# Patient Record
Sex: Male | Born: 1960 | Race: White | Hispanic: No | Marital: Married | Smoking: Never smoker
Health system: Southern US, Community
[De-identification: ages and names within clinical notes are randomized; demographics above are authoritative.]

## PROBLEM LIST (undated history)

## (undated) DIAGNOSIS — I517 Cardiomegaly: Secondary | ICD-10-CM

## (undated) DIAGNOSIS — N400 Enlarged prostate without lower urinary tract symptoms: Secondary | ICD-10-CM

## (undated) DIAGNOSIS — Z9889 Other specified postprocedural states: Secondary | ICD-10-CM

## (undated) DIAGNOSIS — R7989 Other specified abnormal findings of blood chemistry: Secondary | ICD-10-CM

## (undated) DIAGNOSIS — Z8719 Personal history of other diseases of the digestive system: Secondary | ICD-10-CM

## (undated) DIAGNOSIS — I10 Essential (primary) hypertension: Secondary | ICD-10-CM

## (undated) DIAGNOSIS — I351 Nonrheumatic aortic (valve) insufficiency: Secondary | ICD-10-CM

## (undated) DIAGNOSIS — T4145XA Adverse effect of unspecified anesthetic, initial encounter: Secondary | ICD-10-CM

## (undated) DIAGNOSIS — S83289A Other tear of lateral meniscus, current injury, unspecified knee, initial encounter: Secondary | ICD-10-CM

## (undated) DIAGNOSIS — R29898 Other symptoms and signs involving the musculoskeletal system: Secondary | ICD-10-CM

## (undated) DIAGNOSIS — R112 Nausea with vomiting, unspecified: Secondary | ICD-10-CM

## (undated) DIAGNOSIS — M549 Dorsalgia, unspecified: Secondary | ICD-10-CM

## (undated) DIAGNOSIS — G43909 Migraine, unspecified, not intractable, without status migrainosus: Secondary | ICD-10-CM

## (undated) DIAGNOSIS — E559 Vitamin D deficiency, unspecified: Secondary | ICD-10-CM

## (undated) DIAGNOSIS — I519 Heart disease, unspecified: Secondary | ICD-10-CM

## (undated) DIAGNOSIS — K219 Gastro-esophageal reflux disease without esophagitis: Secondary | ICD-10-CM

## (undated) DIAGNOSIS — Z862 Personal history of diseases of the blood and blood-forming organs and certain disorders involving the immune mechanism: Secondary | ICD-10-CM

## (undated) DIAGNOSIS — M169 Osteoarthritis of hip, unspecified: Secondary | ICD-10-CM

## (undated) HISTORY — PX: KNEE ARTHROSCOPY: SHX127

## (undated) HISTORY — PX: BACK SURGERY: SHX140

## (undated) HISTORY — PX: OTHER SURGICAL HISTORY: SHX169

---

## 1996-09-02 DIAGNOSIS — T8859XA Other complications of anesthesia, initial encounter: Secondary | ICD-10-CM

## 1996-09-02 HISTORY — DX: Other complications of anesthesia, initial encounter: T88.59XA

## 2008-09-02 HISTORY — PX: OTHER SURGICAL HISTORY: SHX169

## 2008-09-02 HISTORY — PX: HERNIA REPAIR: SHX51

## 2011-09-03 HISTORY — PX: LUNG BIOPSY: SHX232

## 2015-01-01 DIAGNOSIS — M169 Osteoarthritis of hip, unspecified: Secondary | ICD-10-CM

## 2015-01-01 HISTORY — DX: Osteoarthritis of hip, unspecified: M16.9

## 2015-01-27 ENCOUNTER — Emergency Department (HOSPITAL_COMMUNITY): Payer: BLUE CROSS/BLUE SHIELD

## 2015-01-27 ENCOUNTER — Observation Stay (HOSPITAL_COMMUNITY): Payer: BLUE CROSS/BLUE SHIELD

## 2015-01-27 ENCOUNTER — Observation Stay (HOSPITAL_COMMUNITY)
Admission: EM | Admit: 2015-01-27 | Discharge: 2015-01-29 | Disposition: A | Discharge status: Home / Self Care | Payer: BLUE CROSS/BLUE SHIELD | Attending: Internal Medicine | Admitting: Internal Medicine

## 2015-01-27 ENCOUNTER — Encounter (HOSPITAL_COMMUNITY): Payer: Self-pay | Admitting: Emergency Medicine

## 2015-01-27 DIAGNOSIS — D869 Sarcoidosis, unspecified: Secondary | ICD-10-CM | POA: Insufficient documentation

## 2015-01-27 DIAGNOSIS — R0789 Other chest pain: Secondary | ICD-10-CM | POA: Diagnosis not present

## 2015-01-27 DIAGNOSIS — R4781 Slurred speech: Secondary | ICD-10-CM | POA: Diagnosis present

## 2015-01-27 DIAGNOSIS — I10 Essential (primary) hypertension: Secondary | ICD-10-CM | POA: Diagnosis present

## 2015-01-27 DIAGNOSIS — R202 Paresthesia of skin: Secondary | ICD-10-CM | POA: Diagnosis present

## 2015-01-27 DIAGNOSIS — G4700 Insomnia, unspecified: Secondary | ICD-10-CM | POA: Diagnosis not present

## 2015-01-27 DIAGNOSIS — R7989 Other specified abnormal findings of blood chemistry: Secondary | ICD-10-CM

## 2015-01-27 DIAGNOSIS — R29810 Facial weakness: Secondary | ICD-10-CM | POA: Diagnosis not present

## 2015-01-27 DIAGNOSIS — M25559 Pain in unspecified hip: Secondary | ICD-10-CM | POA: Insufficient documentation

## 2015-01-27 DIAGNOSIS — R471 Dysarthria and anarthria: Secondary | ICD-10-CM | POA: Insufficient documentation

## 2015-01-27 DIAGNOSIS — R51 Headache: Secondary | ICD-10-CM | POA: Diagnosis not present

## 2015-01-27 DIAGNOSIS — N400 Enlarged prostate without lower urinary tract symptoms: Secondary | ICD-10-CM | POA: Diagnosis not present

## 2015-01-27 DIAGNOSIS — R200 Anesthesia of skin: Secondary | ICD-10-CM

## 2015-01-27 DIAGNOSIS — F419 Anxiety disorder, unspecified: Secondary | ICD-10-CM | POA: Insufficient documentation

## 2015-01-27 DIAGNOSIS — E559 Vitamin D deficiency, unspecified: Secondary | ICD-10-CM | POA: Diagnosis present

## 2015-01-27 DIAGNOSIS — I6783 Posterior reversible encephalopathy syndrome: Secondary | ICD-10-CM | POA: Diagnosis present

## 2015-01-27 DIAGNOSIS — Z7952 Long term (current) use of systemic steroids: Secondary | ICD-10-CM | POA: Diagnosis not present

## 2015-01-27 DIAGNOSIS — M25551 Pain in right hip: Secondary | ICD-10-CM | POA: Diagnosis not present

## 2015-01-27 DIAGNOSIS — R079 Chest pain, unspecified: Secondary | ICD-10-CM | POA: Diagnosis not present

## 2015-01-27 DIAGNOSIS — Z862 Personal history of diseases of the blood and blood-forming organs and certain disorders involving the immune mechanism: Secondary | ICD-10-CM

## 2015-01-27 DIAGNOSIS — R0781 Pleurodynia: Secondary | ICD-10-CM | POA: Diagnosis present

## 2015-01-27 DIAGNOSIS — R519 Headache, unspecified: Secondary | ICD-10-CM | POA: Diagnosis present

## 2015-01-27 DIAGNOSIS — Z8709 Personal history of other diseases of the respiratory system: Secondary | ICD-10-CM

## 2015-01-27 DIAGNOSIS — G9340 Encephalopathy, unspecified: Secondary | ICD-10-CM | POA: Diagnosis not present

## 2015-01-27 DIAGNOSIS — R4182 Altered mental status, unspecified: Secondary | ICD-10-CM

## 2015-01-27 HISTORY — DX: Essential (primary) hypertension: I10

## 2015-01-27 HISTORY — DX: Migraine, unspecified, not intractable, without status migrainosus: G43.909

## 2015-01-27 HISTORY — DX: Benign prostatic hyperplasia without lower urinary tract symptoms: N40.0

## 2015-01-27 HISTORY — DX: Other specified abnormal findings of blood chemistry: R79.89

## 2015-01-27 HISTORY — DX: Personal history of diseases of the blood and blood-forming organs and certain disorders involving the immune mechanism: Z86.2

## 2015-01-27 HISTORY — DX: Vitamin D deficiency, unspecified: E55.9

## 2015-01-27 HISTORY — PX: LUMBAR PUNCTURE: SHX1985

## 2015-01-27 LAB — CSF CELL COUNT WITH DIFFERENTIAL
RBC Count, CSF: 70 /mm3 — ABNORMAL HIGH
RBC Count, CSF: 770 /mm3 — ABNORMAL HIGH
Tube #: 1
Tube #: 4
WBC CSF: 1 /mm3 (ref 0–5)
WBC CSF: 1 /mm3 (ref 0–5)

## 2015-01-27 LAB — I-STAT CHEM 8, ED
BUN: 19 mg/dL (ref 6–20)
CALCIUM ION: 1.16 mmol/L (ref 1.12–1.23)
Chloride: 107 mmol/L (ref 101–111)
Creatinine, Ser: 1.2 mg/dL (ref 0.61–1.24)
Glucose, Bld: 170 mg/dL — ABNORMAL HIGH (ref 65–99)
HEMATOCRIT: 43 % (ref 39.0–52.0)
Hemoglobin: 14.6 g/dL (ref 13.0–17.0)
Potassium: 3.6 mmol/L (ref 3.5–5.1)
Sodium: 142 mmol/L (ref 135–145)
TCO2: 18 mmol/L (ref 0–100)

## 2015-01-27 LAB — PROTIME-INR
INR: 1.04 (ref 0.00–1.49)
PROTHROMBIN TIME: 13.8 s (ref 11.6–15.2)

## 2015-01-27 LAB — COMPREHENSIVE METABOLIC PANEL
ALBUMIN: 3.9 g/dL (ref 3.5–5.0)
ALK PHOS: 50 U/L (ref 38–126)
ALT: 25 U/L (ref 17–63)
ANION GAP: 11 (ref 5–15)
AST: 23 U/L (ref 15–41)
BUN: 17 mg/dL (ref 6–20)
CALCIUM: 8.6 mg/dL — AB (ref 8.9–10.3)
CO2: 20 mmol/L — AB (ref 22–32)
CREATININE: 1.33 mg/dL — AB (ref 0.61–1.24)
Chloride: 108 mmol/L (ref 101–111)
GFR calc non Af Amer: 58 mL/min — ABNORMAL LOW (ref >60)
Glucose, Bld: 168 mg/dL — ABNORMAL HIGH (ref 65–99)
POTASSIUM: 3.6 mmol/L (ref 3.5–5.1)
SODIUM: 139 mmol/L (ref 135–145)
TOTAL PROTEIN: 6.2 g/dL — AB (ref 6.5–8.1)
Total Bilirubin: 0.6 mg/dL (ref 0.3–1.2)

## 2015-01-27 LAB — CBC
HCT: 43.3 % (ref 39.0–52.0)
HEMOGLOBIN: 15.3 g/dL (ref 13.0–17.0)
MCH: 29.1 pg (ref 26.0–34.0)
MCHC: 35.3 g/dL (ref 30.0–36.0)
MCV: 82.3 fL (ref 78.0–100.0)
Platelets: 188 10*3/uL (ref 150–400)
RBC: 5.26 MIL/uL (ref 4.22–5.81)
RDW: 12.1 % (ref 11.5–15.5)
WBC: 5 10*3/uL (ref 4.0–10.5)

## 2015-01-27 LAB — DIFFERENTIAL
BASOS PCT: 1 % (ref 0–1)
Basophils Absolute: 0 10*3/uL (ref 0.0–0.1)
EOS PCT: 4 % (ref 0–5)
Eosinophils Absolute: 0.2 10*3/uL (ref 0.0–0.7)
Lymphocytes Relative: 38 % (ref 12–46)
Lymphs Abs: 1.8 10*3/uL (ref 0.7–4.0)
Monocytes Absolute: 0.3 10*3/uL (ref 0.1–1.0)
Monocytes Relative: 7 % (ref 3–12)
NEUTROS ABS: 2.5 10*3/uL (ref 1.7–7.7)
Neutrophils Relative %: 50 % (ref 43–77)

## 2015-01-27 LAB — URINALYSIS, ROUTINE W REFLEX MICROSCOPIC
BILIRUBIN URINE: NEGATIVE
Glucose, UA: NEGATIVE mg/dL
HGB URINE DIPSTICK: NEGATIVE
Ketones, ur: NEGATIVE mg/dL
LEUKOCYTES UA: NEGATIVE
Nitrite: NEGATIVE
PROTEIN: NEGATIVE mg/dL
Specific Gravity, Urine: 1.019 (ref 1.005–1.030)
Urobilinogen, UA: 0.2 mg/dL (ref 0.0–1.0)
pH: 5 (ref 5.0–8.0)

## 2015-01-27 LAB — TROPONIN I: Troponin I: <0.03 ng/mL (ref <0.031)

## 2015-01-27 LAB — RAPID URINE DRUG SCREEN, HOSP PERFORMED
Amphetamines: NOT DETECTED
BENZODIAZEPINES: NOT DETECTED
Barbiturates: NOT DETECTED
COCAINE: NOT DETECTED
Opiates: NOT DETECTED
TETRAHYDROCANNABINOL: NOT DETECTED

## 2015-01-27 LAB — GRAM STAIN

## 2015-01-27 LAB — ETHANOL: Alcohol, Ethyl (B): <5 mg/dL (ref <5)

## 2015-01-27 LAB — APTT: APTT: 40 s — AB (ref 24–37)

## 2015-01-27 LAB — TSH: TSH: 2.788 u[IU]/mL (ref 0.350–4.500)

## 2015-01-27 LAB — PROTEIN AND GLUCOSE, CSF
GLUCOSE CSF: 60 mg/dL (ref 40–70)
Total  Protein, CSF: 74 mg/dL — ABNORMAL HIGH (ref 15–45)

## 2015-01-27 LAB — I-STAT TROPONIN, ED: Troponin i, poc: 0 ng/mL (ref 0.00–0.08)

## 2015-01-27 MED ORDER — LIDOCAINE HCL 2 % IJ SOLN
10.0000 mL | Freq: Once | INTRAMUSCULAR | Status: AC
  Administered 2015-01-27: 200 mg via INTRADERMAL
  Filled 2015-01-27: qty 20

## 2015-01-27 MED ORDER — ACETAMINOPHEN 325 MG PO TABS
650.0000 mg | ORAL_TABLET | Freq: Four times a day (QID) | ORAL | Status: DC | PRN
  Administered 2015-01-27 – 2015-01-29 (×3): 650 mg via ORAL
  Filled 2015-01-27 (×3): qty 2

## 2015-01-27 MED ORDER — ENOXAPARIN SODIUM 40 MG/0.4ML ~~LOC~~ SOLN
40.0000 mg | SUBCUTANEOUS | Status: DC
  Filled 2015-01-27: qty 0.4

## 2015-01-27 MED ORDER — IOHEXOL 350 MG/ML SOLN
50.0000 mL | Freq: Once | INTRAVENOUS | Status: AC | PRN
  Administered 2015-01-27: 50 mL via INTRAVENOUS

## 2015-01-27 MED ORDER — ASPIRIN EC 325 MG PO TBEC
325.0000 mg | DELAYED_RELEASE_TABLET | Freq: Once | ORAL | Status: AC
  Administered 2015-01-27: 325 mg via ORAL
  Filled 2015-01-27: qty 1

## 2015-01-27 MED ORDER — STROKE: EARLY STAGES OF RECOVERY BOOK
Freq: Once | Status: AC
  Administered 2015-01-27: 15:00:00

## 2015-01-27 NOTE — Progress Notes (Signed)
Patient arrived to [id number]. Patient is alert and oriented, NIH 3, VSS, denies any pain, headache, nausea. Tele set up. Patient was oriented to room and floor. Patient vocalized understanding of falls policy, states he will ask for staff assistance prior to ambulation. SCDs ordered. Call bell within patient's reach. Will continue to monitor closely.

## 2015-01-27 NOTE — ED Notes (Signed)
Dr. Rancour at bedside. 
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
## 2015-01-27 NOTE — Progress Notes (Signed)
EEG completed, results pending. 
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
## 2015-01-27 NOTE — Progress Notes (Signed)
Care order completed, [place] from [facility] [place]. Patient aware, patient consent given. Office called, Diplomatic Services operational officersecretary to send medical records prior to closing today.

## 2015-01-27 NOTE — ED Notes (Addendum)
Admitting MD at bedside.  RN on the floor made aware of the reason patient not moved at this time.
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
## 2015-01-27 NOTE — Procedures (Signed)
ELECTROENCEPHALOGRAM REPORT   Patient: Daniel Norman      Room #: A07 ED Age: 54 y.o.        Sex: male Referring Physician: Dr Manus Gunningancour Report Date:  01/27/2015        Interpreting Physician: Omelia BlackwaterSUMNER, Beatriz Quintela JUSTIN  History: Daniel Norman is an 54 y.o. male presenting with lethargy, left facial droop and headache  Medications:  Scheduled: . lidocaine  10 mL Intradermal Once    Conditions of Recording:  This is a 16 channel EEG carried out with the patient in the drowsy state.  Description:  The waking background activity consists of a very low voltage, symmetrical, fairly well organized mix of theta and alpha activity, seen from the parieto-occipital and posterior temporal regions.  Low voltage fast activity, poorly organized, is seen anteriorly and is at times superimposed on more posterior regions.  A mixture of theta and alpha rhythms are seen from the central and temporal regions. No focal slowing or epileptiform activity is noted.   The patient drowses with slowing to irregular, low voltage theta and beta activity. Normal sleep architecture is not observed.   Hyperventilation and intermittent photic stimulation was not performed.     IMPRESSION: Normal drowsy electroencephalogram.There are no focal lateralizing or epileptiform features.   Elspeth Choeter Bernie Fobes, DO [facility] [phone]  If 7pm- 7am, please page neurology on call as listed in AMION. 01/27/2015, 12:57 PM
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
## 2015-01-28 ENCOUNTER — Observation Stay (HOSPITAL_COMMUNITY): Payer: BLUE CROSS/BLUE SHIELD

## 2015-01-28 DIAGNOSIS — G9340 Encephalopathy, unspecified: Secondary | ICD-10-CM

## 2015-01-28 LAB — HEMOGLOBIN A1C
HEMOGLOBIN A1C: 5.4 % (ref 4.8–5.6)
Mean Plasma Glucose: 108 mg/dL

## 2015-01-28 LAB — LIPID PANEL
Cholesterol: 134 mg/dL (ref 0–200)
HDL: 38 mg/dL — ABNORMAL LOW (ref >40)
LDL Cholesterol: 76 mg/dL (ref 0–99)
Total CHOL/HDL Ratio: 3.5 RATIO
Triglycerides: 98 mg/dL (ref <150)
VLDL: 20 mg/dL (ref 0–40)

## 2015-01-28 LAB — C-REACTIVE PROTEIN: CRP: <0.5 mg/dL (ref <1.0)

## 2015-01-28 LAB — BASIC METABOLIC PANEL
Anion gap: 9 (ref 5–15)
BUN: 12 mg/dL (ref 6–20)
CALCIUM: 9.1 mg/dL (ref 8.9–10.3)
CHLORIDE: 104 mmol/L (ref 101–111)
CO2: 27 mmol/L (ref 22–32)
CREATININE: 1.45 mg/dL — AB (ref 0.61–1.24)
GFR calc non Af Amer: 52 mL/min — ABNORMAL LOW (ref >60)
Glucose, Bld: 118 mg/dL — ABNORMAL HIGH (ref 65–99)
Potassium: 4.1 mmol/L (ref 3.5–5.1)
Sodium: 140 mmol/L (ref 135–145)

## 2015-01-28 LAB — SEDIMENTATION RATE: Sed Rate: 5 mm/hr (ref 0–16)

## 2015-01-28 LAB — SODIUM, URINE, RANDOM: Sodium, Ur: 191 mmol/L

## 2015-01-28 LAB — CREATININE, URINE, RANDOM: Creatinine, Urine: 150.33 mg/dL

## 2015-01-28 LAB — TROPONIN I

## 2015-01-28 MED ORDER — GADOBENATE DIMEGLUMINE 529 MG/ML IV SOLN
20.0000 mL | Freq: Once | INTRAVENOUS | Status: AC | PRN
  Administered 2015-01-28: 20 mL via INTRAVENOUS

## 2015-01-28 MED ORDER — CYCLOBENZAPRINE HCL 5 MG PO TABS
7.5000 mg | ORAL_TABLET | Freq: Once | ORAL | Status: AC
  Administered 2015-01-28: 7.5 mg via ORAL
  Filled 2015-01-28: qty 1.5

## 2015-01-28 MED ORDER — FINASTERIDE 5 MG PO TABS
5.0000 mg | ORAL_TABLET | Freq: Every day | ORAL | Status: DC
  Administered 2015-01-28 – 2015-01-29 (×2): 5 mg via ORAL
  Filled 2015-01-28 (×2): qty 1

## 2015-01-28 MED ORDER — TAMSULOSIN HCL 0.4 MG PO CAPS
0.4000 mg | ORAL_CAPSULE | Freq: Every day | ORAL | Status: DC
  Administered 2015-01-28 – 2015-01-29 (×2): 0.4 mg via ORAL
  Filled 2015-01-28 (×2): qty 1
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
## 2015-01-28 NOTE — Progress Notes (Signed)
Patient complaining of neck spasm requesting for a muscle relaxant Call to M.D order for one time dose flexiril  Given.

## 2015-01-28 NOTE — Progress Notes (Signed)
Message sent via Amion text re: patient stating he does not need tele and can be DCd per neuro MD.
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
## 2015-01-28 NOTE — Progress Notes (Signed)
Called to patient room by central tele. Patient has tele box off. When questioned the patient states"the doctor says I dont need this because I didn't have a stroke." Educated that neuro was consulted and his attending would have to DC the telemetry. MD paged at [phone]. Central tele instructed to put on standby until attending MD can clarify.

## 2015-01-28 NOTE — Progress Notes (Signed)
Per conversations with Dr. Hosie PoissonSumner, patient is stable for discharge today. I spoke with Dr. Mikey BussingHoffman who walked me through the patient's discharge orders as I have not directly been involved in this patient's care since his admission.

## 2015-01-29 ENCOUNTER — Observation Stay (HOSPITAL_COMMUNITY): Payer: BLUE CROSS/BLUE SHIELD

## 2015-01-29 DIAGNOSIS — I351 Nonrheumatic aortic (valve) insufficiency: Secondary | ICD-10-CM

## 2015-01-29 DIAGNOSIS — G9340 Encephalopathy, unspecified: Secondary | ICD-10-CM | POA: Diagnosis not present

## 2015-01-29 DIAGNOSIS — R55 Syncope and collapse: Secondary | ICD-10-CM

## 2015-01-29 DIAGNOSIS — I6783 Posterior reversible encephalopathy syndrome: Secondary | ICD-10-CM | POA: Diagnosis not present

## 2015-01-29 DIAGNOSIS — I5189 Other ill-defined heart diseases: Secondary | ICD-10-CM

## 2015-01-29 DIAGNOSIS — I517 Cardiomegaly: Secondary | ICD-10-CM

## 2015-01-29 HISTORY — DX: Other ill-defined heart diseases: I51.89

## 2015-01-29 HISTORY — DX: Cardiomegaly: I51.7

## 2015-01-29 HISTORY — DX: Nonrheumatic aortic (valve) insufficiency: I35.1

## 2015-01-29 LAB — BASIC METABOLIC PANEL
Anion gap: 5 (ref 5–15)
BUN: 14 mg/dL (ref 6–20)
CALCIUM: 8.8 mg/dL — AB (ref 8.9–10.3)
CHLORIDE: 107 mmol/L (ref 101–111)
CO2: 27 mmol/L (ref 22–32)
Creatinine, Ser: 1.31 mg/dL — ABNORMAL HIGH (ref 0.61–1.24)
GFR calc Af Amer: >60 mL/min (ref >60)
GFR, EST NON AFRICAN AMERICAN: 59 mL/min — AB (ref >60)
Glucose, Bld: 99 mg/dL (ref 65–99)
Potassium: 3.4 mmol/L — ABNORMAL LOW (ref 3.5–5.1)
SODIUM: 139 mmol/L (ref 135–145)

## 2015-01-29 MED ORDER — METHOCARBAMOL 500 MG PO TABS
1000.0000 mg | ORAL_TABLET | Freq: Four times a day (QID) | ORAL | Status: DC | PRN
  Administered 2015-01-29 (×2): 1000 mg via ORAL
  Filled 2015-01-29 (×2): qty 2

## 2015-01-29 MED ORDER — TERAZOSIN HCL 5 MG PO CAPS
5.0000 mg | ORAL_CAPSULE | Freq: Every day | ORAL | Status: DC
  Filled 2015-01-29: qty 1

## 2015-01-29 MED ORDER — LOSARTAN POTASSIUM 50 MG PO TABS: 50.0000 mg | ORAL_TABLET | Freq: Every day | ORAL | Status: DC

## 2015-01-29 MED ORDER — METHOCARBAMOL 750 MG PO TABS: 750.0000 mg | ORAL_TABLET | Freq: Four times a day (QID) | ORAL | Status: DC | PRN

## 2015-01-29 NOTE — Progress Notes (Signed)
  Echocardiogram 2D Echocardiogram has been performed.  Arvil ChacoFoster, Alyric Parkin 01/29/2015, 3:58 PM
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
## 2015-01-29 NOTE — Progress Notes (Signed)
OT Cancellation Note and Discharge  Patient Details Name: Daniel Norman MRN: [id number] DOB: 1961-01-31   Cancelled Treatment:    Reason Eval/Treat Not Completed: OT screened, no needs identified, will sign off. Pt back to baseline.  [name], [name] Eva [phone] 01/29/2015, 3:18 PM
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
## 2015-01-29 NOTE — Discharge Instructions (Signed)
Please see your PCP or try to schedule a visit with a primary care physician in [place].

## 2015-01-30 LAB — EPSTEIN BARR VRS(EBV DNA BY PCR)
EBV DNA QN BY PCR: NEGATIVE {copies}/mL
EBV DNA QN by PCR: NEGATIVE copies/mL
LOG10 EBV DNA QN PCR: UNDETERMINED {Log_copies}/mL
log10 EBV DNA Qn PCR: UNDETERMINED log10copy/mL

## 2015-01-31 LAB — CSF CULTURE W GRAM STAIN: Gram Stain: NONE SEEN

## 2015-01-31 LAB — CSF CULTURE: CULTURE: NO GROWTH

## 2015-01-31 LAB — HERPES SIMPLEX VIRUS(HSV) DNA BY PCR
HSV 1 DNA: NEGATIVE
HSV 2 DNA: NEGATIVE

## 2015-02-02 LAB — ANGIOTENSIN CONVERTING ENZYME, CSF: ANGIO CONVERT ENZYME: 1.3 U/L (ref 0.0–2.5)

## 2018-04-07 ENCOUNTER — Other Ambulatory Visit: Payer: Self-pay | Admitting: Orthopedic Surgery

## 2018-04-24 ENCOUNTER — Encounter (HOSPITAL_BASED_OUTPATIENT_CLINIC_OR_DEPARTMENT_OTHER): Payer: Self-pay

## 2018-04-27 ENCOUNTER — Encounter (HOSPITAL_BASED_OUTPATIENT_CLINIC_OR_DEPARTMENT_OTHER): Payer: Self-pay | Admitting: *Deleted

## 2018-04-27 ENCOUNTER — Other Ambulatory Visit: Payer: Self-pay

## 2018-04-27 NOTE — Progress Notes (Signed)
Spoke with [employer] after midnight food, clear liquids from midnight until 700 am day of surgery, then npo  arrive 1100 am 05-01-18 [facility] meds to take:xanax prn, losartan (cozaar), flomax, cyclobanzaprine pen, aciphex Driver wife Eunice Blasedebbie will stay for surgery Surgery orders in epic Needs istat 4 and ekg
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
## 2018-05-01 ENCOUNTER — Ambulatory Visit (HOSPITAL_BASED_OUTPATIENT_CLINIC_OR_DEPARTMENT_OTHER): Payer: BLUE CROSS/BLUE SHIELD | Admitting: Anesthesiology

## 2018-05-01 ENCOUNTER — Ambulatory Visit (HOSPITAL_BASED_OUTPATIENT_CLINIC_OR_DEPARTMENT_OTHER)
Admission: RE | Admit: 2018-05-01 | Discharge: 2018-05-01 | Disposition: A | Discharge status: Home / Self Care | Payer: BLUE CROSS/BLUE SHIELD | Source: Ambulatory Visit | Attending: Orthopedic Surgery | Admitting: Orthopedic Surgery

## 2018-05-01 ENCOUNTER — Encounter (HOSPITAL_BASED_OUTPATIENT_CLINIC_OR_DEPARTMENT_OTHER): Payer: Self-pay | Admitting: *Deleted

## 2018-05-01 ENCOUNTER — Encounter (HOSPITAL_BASED_OUTPATIENT_CLINIC_OR_DEPARTMENT_OTHER)
Admission: RE | Disposition: A | Discharge status: Home / Self Care | Payer: Self-pay | Source: Ambulatory Visit | Attending: Orthopedic Surgery

## 2018-05-01 ENCOUNTER — Other Ambulatory Visit: Payer: Self-pay

## 2018-05-01 DIAGNOSIS — M19041 Primary osteoarthritis, right hand: Secondary | ICD-10-CM | POA: Insufficient documentation

## 2018-05-01 DIAGNOSIS — S83232A Complex tear of medial meniscus, current injury, left knee, initial encounter: Secondary | ICD-10-CM | POA: Diagnosis present

## 2018-05-01 DIAGNOSIS — N400 Enlarged prostate without lower urinary tract symptoms: Secondary | ICD-10-CM | POA: Insufficient documentation

## 2018-05-01 DIAGNOSIS — I351 Nonrheumatic aortic (valve) insufficiency: Secondary | ICD-10-CM | POA: Insufficient documentation

## 2018-05-01 DIAGNOSIS — R011 Cardiac murmur, unspecified: Secondary | ICD-10-CM | POA: Insufficient documentation

## 2018-05-01 DIAGNOSIS — K219 Gastro-esophageal reflux disease without esophagitis: Secondary | ICD-10-CM | POA: Diagnosis not present

## 2018-05-01 DIAGNOSIS — X58XXXA Exposure to other specified factors, initial encounter: Secondary | ICD-10-CM | POA: Insufficient documentation

## 2018-05-01 DIAGNOSIS — Z79899 Other long term (current) drug therapy: Secondary | ICD-10-CM | POA: Diagnosis not present

## 2018-05-01 DIAGNOSIS — M19012 Primary osteoarthritis, left shoulder: Secondary | ICD-10-CM | POA: Insufficient documentation

## 2018-05-01 DIAGNOSIS — M19021 Primary osteoarthritis, right elbow: Secondary | ICD-10-CM | POA: Diagnosis not present

## 2018-05-01 DIAGNOSIS — M19022 Primary osteoarthritis, left elbow: Secondary | ICD-10-CM | POA: Diagnosis not present

## 2018-05-01 DIAGNOSIS — I10 Essential (primary) hypertension: Secondary | ICD-10-CM | POA: Diagnosis not present

## 2018-05-01 DIAGNOSIS — M19011 Primary osteoarthritis, right shoulder: Secondary | ICD-10-CM | POA: Insufficient documentation

## 2018-05-01 DIAGNOSIS — S83242A Other tear of medial meniscus, current injury, left knee, initial encounter: Secondary | ICD-10-CM | POA: Diagnosis present

## 2018-05-01 DIAGNOSIS — M2242 Chondromalacia patellae, left knee: Secondary | ICD-10-CM | POA: Insufficient documentation

## 2018-05-01 DIAGNOSIS — M19042 Primary osteoarthritis, left hand: Secondary | ICD-10-CM | POA: Diagnosis not present

## 2018-05-01 DIAGNOSIS — M160 Bilateral primary osteoarthritis of hip: Secondary | ICD-10-CM | POA: Insufficient documentation

## 2018-05-01 DIAGNOSIS — M94262 Chondromalacia, left knee: Secondary | ICD-10-CM | POA: Diagnosis present

## 2018-05-01 HISTORY — DX: Osteoarthritis of hip, unspecified: M16.9

## 2018-05-01 HISTORY — DX: Nonrheumatic aortic (valve) insufficiency: I35.1

## 2018-05-01 HISTORY — DX: Gastro-esophageal reflux disease without esophagitis: K21.9

## 2018-05-01 HISTORY — DX: Heart disease, unspecified: I51.9

## 2018-05-01 HISTORY — DX: Cardiomegaly: I51.7

## 2018-05-01 HISTORY — DX: Adverse effect of unspecified anesthetic, initial encounter: T41.45XA

## 2018-05-01 HISTORY — DX: Other symptoms and signs involving the musculoskeletal system: R29.898

## 2018-05-01 HISTORY — DX: Personal history of other diseases of the digestive system: Z87.19

## 2018-05-01 HISTORY — PX: KNEE ARTHROSCOPY: SHX127

## 2018-05-01 HISTORY — DX: Other specified postprocedural states: Z98.890

## 2018-05-01 HISTORY — DX: Dorsalgia, unspecified: M54.9

## 2018-05-01 HISTORY — DX: Nausea with vomiting, unspecified: R11.2

## 2018-05-01 LAB — POCT I-STAT 4, (NA,K, GLUC, HGB,HCT)
Glucose, Bld: 108 mg/dL — ABNORMAL HIGH (ref 70–99)
HCT: 43 % (ref 39.0–52.0)
Hemoglobin: 14.6 g/dL (ref 13.0–17.0)
POTASSIUM: 3.8 mmol/L (ref 3.5–5.1)
SODIUM: 143 mmol/L (ref 135–145)

## 2018-05-01 SURGERY — ARTHROSCOPY, KNEE
Anesthesia: General | Site: Knee | Laterality: Left

## 2018-05-01 MED ORDER — PHENYLEPHRINE 40 MCG/ML (10ML) SYRINGE FOR IV PUSH (FOR BLOOD PRESSURE SUPPORT)
PREFILLED_SYRINGE | INTRAVENOUS | Status: AC
  Filled 2018-05-01: qty 10

## 2018-05-01 MED ORDER — DEXAMETHASONE SODIUM PHOSPHATE 10 MG/ML IJ SOLN
INTRAMUSCULAR | Status: AC
  Filled 2018-05-01: qty 1

## 2018-05-01 MED ORDER — LACTATED RINGERS IV SOLN
INTRAVENOUS | Status: DC
  Administered 2018-05-01: 10:00:00 via INTRAVENOUS
  Filled 2018-05-01: qty 1000

## 2018-05-01 MED ORDER — ONDANSETRON HCL 4 MG/2ML IJ SOLN
INTRAMUSCULAR | Status: AC
  Filled 2018-05-01: qty 2

## 2018-05-01 MED ORDER — LACTATED RINGERS IV SOLN
INTRAVENOUS | Status: DC
  Filled 2018-05-01: qty 1000

## 2018-05-01 MED ORDER — PROMETHAZINE HCL 25 MG/ML IJ SOLN
6.2500 mg | INTRAMUSCULAR | Status: DC | PRN
  Filled 2018-05-01: qty 1

## 2018-05-01 MED ORDER — HYDROCODONE-ACETAMINOPHEN 5-325 MG PO TABS: 1.0000 | ORAL_TABLET | Freq: Four times a day (QID) | ORAL | 0 refills | Status: DC | PRN

## 2018-05-01 MED ORDER — CHLORHEXIDINE GLUCONATE 4 % EX LIQD
60.0000 mL | Freq: Once | CUTANEOUS | Status: DC
  Filled 2018-05-01: qty 118

## 2018-05-01 MED ORDER — LIDOCAINE 2% (20 MG/ML) 5 ML SYRINGE
INTRAMUSCULAR | Status: DC | PRN
  Administered 2018-05-01: 100 mg via INTRAVENOUS

## 2018-05-01 MED ORDER — MEPERIDINE HCL 25 MG/ML IJ SOLN
6.2500 mg | INTRAMUSCULAR | Status: DC | PRN
  Filled 2018-05-01: qty 1

## 2018-05-01 MED ORDER — EPINEPHRINE PF 1 MG/ML IJ SOLN
INTRAMUSCULAR | Status: DC | PRN
  Administered 2018-05-01: 1 mL

## 2018-05-01 MED ORDER — HYDROMORPHONE HCL 1 MG/ML IJ SOLN
INTRAMUSCULAR | Status: AC
  Filled 2018-05-01: qty 1

## 2018-05-01 MED ORDER — SODIUM CHLORIDE 0.9 % IR SOLN
Status: DC | PRN
  Administered 2018-05-01: 3000 mL

## 2018-05-01 MED ORDER — CLINDAMYCIN PHOSPHATE 900 MG/50ML IV SOLN
INTRAVENOUS | Status: AC
  Filled 2018-05-01: qty 50

## 2018-05-01 MED ORDER — ONDANSETRON HCL 4 MG/2ML IJ SOLN
INTRAMUSCULAR | Status: DC | PRN
  Administered 2018-05-01: 4 mg via INTRAVENOUS

## 2018-05-01 MED ORDER — FENTANYL CITRATE (PF) 100 MCG/2ML IJ SOLN
INTRAMUSCULAR | Status: DC | PRN
  Administered 2018-05-01 (×2): 50 ug via INTRAVENOUS

## 2018-05-01 MED ORDER — OXYCODONE HCL 5 MG/5ML PO SOLN
5.0000 mg | Freq: Once | ORAL | Status: DC | PRN
  Filled 2018-05-01: qty 5

## 2018-05-01 MED ORDER — MIDAZOLAM HCL 2 MG/2ML IJ SOLN
INTRAMUSCULAR | Status: AC
  Filled 2018-05-01: qty 2

## 2018-05-01 MED ORDER — OXYCODONE HCL 5 MG PO TABS
5.0000 mg | ORAL_TABLET | Freq: Once | ORAL | Status: DC | PRN
  Filled 2018-05-01: qty 1

## 2018-05-01 MED ORDER — PHENYLEPHRINE 40 MCG/ML (10ML) SYRINGE FOR IV PUSH (FOR BLOOD PRESSURE SUPPORT)
PREFILLED_SYRINGE | INTRAVENOUS | Status: DC | PRN
  Administered 2018-05-01: 40 ug via INTRAVENOUS
  Administered 2018-05-01: 80 ug via INTRAVENOUS

## 2018-05-01 MED ORDER — FENTANYL CITRATE (PF) 100 MCG/2ML IJ SOLN
INTRAMUSCULAR | Status: AC
  Filled 2018-05-01: qty 2

## 2018-05-01 MED ORDER — CLINDAMYCIN PHOSPHATE 900 MG/50ML IV SOLN
900.0000 mg | INTRAVENOUS | Status: AC
  Administered 2018-05-01: 900 mg via INTRAVENOUS
  Filled 2018-05-01: qty 50

## 2018-05-01 MED ORDER — LIDOCAINE 2% (20 MG/ML) 5 ML SYRINGE
INTRAMUSCULAR | Status: AC
  Filled 2018-05-01: qty 5

## 2018-05-01 MED ORDER — PROPOFOL 10 MG/ML IV BOLUS
INTRAVENOUS | Status: DC | PRN
  Administered 2018-05-01: 200 mg via INTRAVENOUS

## 2018-05-01 MED ORDER — HYDROMORPHONE HCL 1 MG/ML IJ SOLN
0.2500 mg | INTRAMUSCULAR | Status: DC | PRN
  Administered 2018-05-01: 0.25 mg via INTRAVENOUS
  Filled 2018-05-01: qty 0.5

## 2018-05-01 MED ORDER — BUPIVACAINE HCL (PF) 0.25 % IJ SOLN
INTRAMUSCULAR | Status: DC | PRN
  Administered 2018-05-01: 20 mL

## 2018-05-01 MED ORDER — PROPOFOL 10 MG/ML IV BOLUS
INTRAVENOUS | Status: AC
  Filled 2018-05-01: qty 20

## 2018-05-01 MED ORDER — DEXAMETHASONE SODIUM PHOSPHATE 4 MG/ML IJ SOLN
INTRAMUSCULAR | Status: DC | PRN
  Administered 2018-05-01: 10 mg via INTRAVENOUS

## 2018-05-01 MED ORDER — MIDAZOLAM HCL 5 MG/5ML IJ SOLN
INTRAMUSCULAR | Status: DC | PRN
  Administered 2018-05-01: 2 mg via INTRAVENOUS

## 2018-05-01 SURGICAL SUPPLY — 43 items
BANDAGE ACE 6X5 VEL STRL LF (GAUZE/BANDAGES/DRESSINGS) ×2 IMPLANT
BANDAGE ELASTIC 6 VELCRO ST LF (GAUZE/BANDAGES/DRESSINGS) ×2 IMPLANT
BLADE 4.2CUDA (BLADE) ×2 IMPLANT
BLADE CUTTER GATOR 3.5 (BLADE) ×2 IMPLANT
BLADE GREAT WHITE 4.2 (BLADE) IMPLANT
BOOTIES KNEE HIGH SLOAN (MISCELLANEOUS) ×4 IMPLANT
CANISTER SUCT 3000ML PPV (MISCELLANEOUS) IMPLANT
CANISTER SUCTION 1200CC (MISCELLANEOUS) IMPLANT
CLOTH BEACON ORANGE TIMEOUT ST (SAFETY) ×2 IMPLANT
CUTTER MENISCUS  4.2MM (BLADE)
CUTTER MENISCUS 4.2MM (BLADE) IMPLANT
DRAPE ARTHROSCOPY W/POUCH 114 (DRAPES) ×2 IMPLANT
DRSG ADAPTIC 3X8 NADH LF (GAUZE/BANDAGES/DRESSINGS) ×2 IMPLANT
DRSG EMULSION OIL 3X3 NADH (GAUZE/BANDAGES/DRESSINGS) ×2 IMPLANT
DURAPREP 26ML APPLICATOR (WOUND CARE) ×2 IMPLANT
ELECT MENISCUS 165MM 90D (ELECTRODE) ×2 IMPLANT
ELECT REM PT RETURN 9FT ADLT (ELECTROSURGICAL)
ELECTRODE REM PT RTRN 9FT ADLT (ELECTROSURGICAL) IMPLANT
GAUZE SPONGE 4X4 12PLY STRL (GAUZE/BANDAGES/DRESSINGS) ×2 IMPLANT
GLOVE ECLIPSE 7.5 STRL STRAW (GLOVE) ×4 IMPLANT
GLOVE INDICATOR 8.0 STRL GRN (GLOVE) ×4 IMPLANT
GOWN STRL REUS W/TWL LRG LVL3 (GOWN DISPOSABLE) ×2 IMPLANT
GOWN STRL REUS W/TWL XL LVL3 (GOWN DISPOSABLE) ×2 IMPLANT
KIT TURNOVER CYSTO (KITS) ×2 IMPLANT
KNEE WRAP E Z 3 GEL PACK (MISCELLANEOUS) ×2 IMPLANT
MANIFOLD NEPTUNE II (INSTRUMENTS) IMPLANT
NDL SAFETY ECLIPSE 18X1.5 (NEEDLE) IMPLANT
NEEDLE FILTER BLUNT 18X 1/2SAF (NEEDLE)
NEEDLE FILTER BLUNT 18X1 1/2 (NEEDLE) IMPLANT
NEEDLE HYPO 18GX1.5 SHARP (NEEDLE)
PACK ARTHROSCOPY DSU (CUSTOM PROCEDURE TRAY) ×2 IMPLANT
PACK BASIN DAY SURGERY FS (CUSTOM PROCEDURE TRAY) ×2 IMPLANT
PAD CAST 4YDX4 CTTN HI CHSV (CAST SUPPLIES) IMPLANT
PADDING CAST COTTON 4X4 STRL (CAST SUPPLIES)
PADDING CAST COTTON 6X4 STRL (CAST SUPPLIES) ×2 IMPLANT
PROBE BIPOLAR ATHRO 135MM 90D (MISCELLANEOUS) IMPLANT
SET ARTHROSCOPY TUBING (MISCELLANEOUS) ×1
SET ARTHROSCOPY TUBING LN (MISCELLANEOUS) ×1 IMPLANT
SUT ETHILON 4 0 PS 2 18 (SUTURE) IMPLANT
SYR 10ML LL (SYRINGE) ×2 IMPLANT
TOWEL OR 17X24 6PK STRL BLUE (TOWEL DISPOSABLE) ×4 IMPLANT
TUBE CONNECTING 12X1/4 (SUCTIONS) IMPLANT
WATER STERILE IRR 500ML POUR (IV SOLUTION) ×2 IMPLANT

## 2018-05-01 NOTE — Transfer of Care (Signed)
Immediate Anesthesia Transfer of Care Note  Patient: Daniel Norman  Procedure(s) Performed: ARTHROSCOPY LEFT KNEE, MENISCECTOMY, MEDIAL CHONDRAPLASTY (Left Knee)  Patient Location: PACU  Anesthesia Type:General  Level of Consciousness: sedated and patient cooperative  Airway & Oxygen Therapy: Patient Spontanous Breathing and Patient connected to face mask oxygen  Post-op Assessment: Report given to RN and Post -op Vital signs reviewed and stable  Post vital signs: Reviewed and stable  Last Vitals:  Vitals Value Taken Time  BP    Temp    Pulse 81 05/01/2018 12:47 PM  Resp 10 05/01/2018 12:47 PM  SpO2 97 % 05/01/2018 12:47 PM  Vitals shown include unvalidated device data.  Last Pain:  Vitals:   05/01/18 0956  TempSrc:   PainSc: 0-No pain      Patients Stated Pain Goal: 6 (05/01/18 0956)  Complications: No apparent anesthesia complications

## 2018-05-01 NOTE — Brief Op Note (Signed)
05/01/2018  12:47 PM  PATIENT:  Daniel Norman  57 y.o. male  PRE-OPERATIVE DIAGNOSIS:  LEFT KNEE MEDIAL MENISCAL TEAR  POST-OPERATIVE DIAGNOSIS:  LEFT KNEE MEDIAL MENISCAL TEAR  PROCEDURE:  Procedure(s): ARTHROSCOPY LEFT KNEE, MENISCECTOMY, MEDIAL CHONDRAPLASTY (Left)  SURGEON:  Surgeon(s) and Role:    Jodi Geralds* Carrolyn Hilmes, MD - Primary  PHYSICIAN ASSISTANT:   ASSISTANTS: jim bethune   ANESTHESIA:   general  EBL:  none  BLOOD ADMINISTERED:none  DRAINS: none   LOCAL MEDICATIONS USED:  MARCAINE     SPECIMEN:  No Specimen  DISPOSITION OF SPECIMEN:  N/A  COUNTS:  YES  TOURNIQUET:  * No tourniquets in log *  DICTATION: .Other Dictation: Dictation Number [id number]  PLAN OF CARE: Discharge to home after PACU  PATIENT DISPOSITION:  PACU - hemodynamically stable.   Delay start of Pharmacological VTE agent (>24hrs) due to surgical blood loss or risk of bleeding: no

## 2018-05-01 NOTE — Anesthesia Procedure Notes (Signed)
Procedure Name: LMA Insertion Date/Time: 05/01/2018 12:10 PM Performed by: Shelton SilvasHollis, Kevin D, MD Pre-anesthesia Checklist: Patient identified, Emergency Drugs available, Suction available and Patient being monitored Patient Re-evaluated:Patient Re-evaluated prior to induction Oxygen Delivery Method: Circle system utilized Preoxygenation: Pre-oxygenation with 100% oxygen Induction Type: IV induction Ventilation: Mask ventilation without difficulty LMA: LMA inserted LMA Size: 5.0 Number of attempts: 1 Airway Equipment and Method: Bite block Placement Confirmation: positive ETCO2 and breath sounds checked- equal and bilateral Tube secured with: Tape Dental Injury: Teeth and Oropharynx as per pre-operative assessment

## 2018-05-01 NOTE — Op Note (Signed)
NAME: Daniel Norman, Juvon E. MEDICAL RECORD [id number] ACCOUNT [id number] DATE OF BIRTH:December 18, 1960 FACILITY: [facility] LOCATION: [facility] PHYSICIAN:Lennox Dolberry Starling MannsL. Lionardo Haze, MD  OPERATIVE REPORT  DATE OF PROCEDURE:  05/01/2018  PREOPERATIVE DIAGNOSES:  Medial meniscal tear.  POSTOPERATIVE DIAGNOSES:   1.  Medial meniscal tear.   2.  Chondromalacia patellofemoral joint.  PROCEDURE: 1.  Partial medial meniscectomy. 2.  Chondroplasty patellofemoral joint.  SURGEON:  Jodi GeraldsJohn Quiara Killian, MD  ANESTHESIA:  General.  BRIEF HISTORY:  The patient is a 57 year old male with a long history of complaints of medial sided left knee pain.  He has been treated conservatively for a prolonged period of time and after failure of all conservative care, is taken to the operating  room for left knee arthroscopy.    Preoperative MRI showed that he had a medial meniscal tear.  He was brought to the operating room for this procedure.  DESCRIPTION OF PROCEDURE:  The patient brought to the operating room after adequate anesthesia obtained with general anesthetic, the patient was brought to the operating table, right leg prepped and draped in sterile fashion.  Following this, routine  arthroscopic examination revealed there is an obvious chondromalacia patellofemoral joint.  It is really an extensive chondromalacia that went through the trochlea and all the way down into the notch.  This was debrided with a straight biting forceps and  remaining meniscal rim was contoured down with a suction shaver.  Once this was completed, attention was turned to the medial compartment, there was a complex tear of the medial meniscus right at the mid body and this was debrided with a straight biting  forceps and upbiting forceps and then a suction shaver was used to contour this back to a smooth and stable rim.  Once this was completed, attention was turned towards the ACL, which was normal.  Lateral side was normal.  The knee at this time had a   final check for any loosened fragmented pieces and the knee was irrigated and suctioned dry and the 25 mL of 0.25% Marcaine was instilled in the knee for postoperative anesthesia and around the portals and the knee.    Sterile compressive dressing was applied and the patient was taken to recovery was noted to be in satisfactory condition.  Estimated blood loss for procedure was minimal.  AN/NUANCE  D:05/01/2018 T:05/01/2018 JOB:[id number]

## 2018-05-01 NOTE — Discharge Instructions (Signed)
Post Anesthesia Home Care Instructions  Activity: Get plenty of rest for the remainder of the day. A responsible individual must stay with you for 24 hours following the procedure.  For the next 24 hours, DO NOT: -Drive a car -Operate machinery -Drink alcoholic beverages -Take any medication unless instructed by your physician -Make any legal decisions or sign important papers.  Meals: Start with liquid foods such as gelatin or soup. Progress to regular foods as tolerated. Avoid greasy, spicy, heavy foods. If nausea and/or vomiting occur, drink only clear liquids until the nausea and/or vomiting subsides. Call your physician if vomiting continues.  Special Instructions/Symptoms: Your throat may feel dry or sore from the anesthesia or the breathing tube placed in your throat during surgery. If this causes discomfort, gargle with warm salt water. The discomfort should disappear within 24 hours.  If you had a scopolamine patch placed behind your ear for the management of post- operative nausea and/or vomiting:  1. The medication in the patch is effective for 72 hours, after which it should be removed.  Wrap patch in a tissue and discard in the trash. Wash hands thoroughly with soap and water. 2. You may remove the patch earlier than 72 hours if you experience unpleasant side effects which may include dry mouth, dizziness or visual disturbances. 3. Avoid touching the patch. Wash your hands with soap and water after contact with the patch.    POST-OP KNEE ARTHROSCOPY INSTRUCTIONS  Dr. John Graves/Jim Bethune PA-C  Pain You will be expected to have a moderate amount of pain in the affected knee for approximately two weeks. However, the first two days will be the most severe pain. A prescription has been provided to take as needed for the pain. The pain can be reduced by applying ice packs to the knee for the first 1-2 weeks post surgery. Also, keeping the leg elevated on pillows will help  alleviate the pain. If you develop any acute pain or swelling in your calf muscle, please call the doctor.  Activity It is preferred that you stay at bed rest for approximately 24 hours. However, you may go to the bathroom with help. Weight bearing as tolerated. You may begin the knee exercises the day of surgery. Discontinue crutches as the knee pain resolves.  Dressing Keep the dressing dry. If the ace bandage should wrinkle or roll up, this can be rewrapped to prevent ridges in the bandage. You may remove all dressings in 48 hours,  apply bandaids to each wound. You may shower on the 4th day after surgery but no tub bath.  Symptoms to report to your doctor Extreme pain Extreme swelling Temperature above 101 degrees Change in the feeling, color, or movement of your toes Redness, heat, or swelling at your incision  Exercise If is preferred that as soon as possible you try to do a straight leg raise without bending the knee and concentrate on bringing the heel of your foot off the bed up to approximately 45 degrees and hold for the count of 10 seconds. Repeat this at least 10 times three or four times per day. Additional exercises are provided below.  You are encouraged to bend the knee as tolerated.  Follow-Up Call to schedule a follow-up appointment in 5-7 days. Our office # is [phone].  POST-OP EXERCISES  Short Arc Quads  1. Lie on back with legs straight. Place towel roll under thigh, just above knee. 2. Tighten thigh muscles to straighten knee and lift heel off   bed. 3. Hold for slow count of five, then lower. 4. Do three sets of ten    Straight Leg Raises  1. Lie on back with operative leg straight and other leg bent. 2. Keeping operative leg completely straight, slowly lift operative leg so foot is 5 inches off bed. 3. Hold for slow count of five, then lower. 4. Do three sets of ten.    DO BOTH EXERCISES 2 TIMES A DAY  Ankle Pumps  Work/move the operative ankle  and foot up and down 10 times every hour while awake.  

## 2018-05-01 NOTE — Addendum Note (Signed)
Addendum  created 05/01/18 1339 by Norva Pavlovallaway, Vernita Tague G, CRNA   Attestation recorded in [facility], Intraprocedure Attestations filed

## 2018-05-01 NOTE — Anesthesia Postprocedure Evaluation (Signed)
Anesthesia Post Note  Patient: Daniel Norman  Procedure(s) Performed: ARTHROSCOPY LEFT KNEE, MENISCECTOMY, MEDIAL CHONDRAPLASTY (Left Knee)     Patient location during evaluation: PACU Anesthesia Type: General Level of consciousness: awake and alert Pain management: pain level controlled Vital Signs Assessment: post-procedure vital signs reviewed and stable Respiratory status: spontaneous breathing, nonlabored ventilation, respiratory function stable and patient connected to nasal cannula oxygen Cardiovascular status: blood pressure returned to baseline and stable Postop Assessment: no apparent nausea or vomiting Anesthetic complications: no    Last Vitals:  Vitals:   05/01/18 1245 05/01/18 1300  BP: 103/68 122/80  Pulse: 80 83  Resp: (!) 9 10  Temp: 36.4 C   SpO2: 97% 99%    Last Pain:  Vitals:   05/01/18 1245  TempSrc:   PainSc: Asleep                 Shelton SilvasKevin D Clarissa Laird

## 2018-05-01 NOTE — Anesthesia Preprocedure Evaluation (Addendum)
Anesthesia Evaluation  Patient identified by MRN, date of birth, ID band Patient awake    Reviewed: Allergy & Precautions, NPO status , Patient's Chart, lab work & pertinent test results  History of Anesthesia Complications (+) PONV and history of anesthetic complications  Airway Mallampati: III  TM Distance: >3 FB Neck ROM: Full    Dental  (+) Teeth Intact, Dental Advisory Given   Pulmonary    breath sounds clear to auscultation       Cardiovascular hypertension, Pt. on medications + Valvular Problems/Murmurs AI  Rhythm:Regular Rate:Normal     Neuro/Psych  Headaches,    GI/Hepatic Neg liver ROS, hiatal hernia, GERD  ,  Endo/Other  negative endocrine ROS  Renal/GU negative Renal ROS     Musculoskeletal  (+) Arthritis ,   Abdominal Normal abdominal exam  (+)   Peds  Hematology negative hematology ROS (+)   Anesthesia Other Findings Adequate mouth opening despite previous injury  Reproductive/Obstetrics                           Lab Results  Component Value Date   WBC 5.0 01/27/2015   HGB 14.6 05/01/2018   HCT 43.0 05/01/2018   MCV 82.3 01/27/2015   PLT 188 01/27/2015   EKG: normal sinus rhythm.  Anesthesia Physical Anesthesia Plan  ASA: II  Anesthesia Plan: General   Post-op Pain Management:    Induction: Intravenous  PONV Risk Score and Plan: 4 or greater and Ondansetron, Dexamethasone, Midazolam and Scopolamine patch - Pre-op  Airway Management Planned: LMA  Additional Equipment: None  Intra-op Plan:   Post-operative Plan: Extubation in OR  Informed Consent: I have reviewed the patients History and Physical, chart, labs and discussed the procedure including the risks, benefits and alternatives for the proposed anesthesia with the patient or authorized representative who has indicated his/her understanding and acceptance.   Dental advisory given  Plan Discussed  with: CRNA  Anesthesia Plan Comments:        Anesthesia Quick Evaluation

## 2018-05-05 ENCOUNTER — Encounter (HOSPITAL_BASED_OUTPATIENT_CLINIC_OR_DEPARTMENT_OTHER): Payer: Self-pay | Admitting: Orthopedic Surgery

## 2018-09-04 ENCOUNTER — Other Ambulatory Visit: Payer: Self-pay | Admitting: Orthopedic Surgery

## 2018-09-14 ENCOUNTER — Encounter (HOSPITAL_BASED_OUTPATIENT_CLINIC_OR_DEPARTMENT_OTHER): Payer: Self-pay | Admitting: *Deleted

## 2018-09-14 ENCOUNTER — Other Ambulatory Visit: Payer: Self-pay

## 2018-09-18 ENCOUNTER — Encounter (HOSPITAL_BASED_OUTPATIENT_CLINIC_OR_DEPARTMENT_OTHER): Payer: Self-pay | Admitting: Anesthesiology

## 2018-09-18 NOTE — Anesthesia Preprocedure Evaluation (Addendum)
Anesthesia Evaluation  Patient identified by MRN, date of birth, ID band Patient awake  General Assessment Comment:Probable difficult airway- hx/o left mandibular condyle Fx during intubation in [place], [place]  In the 90's   Reviewed: Allergy & Precautions, NPO status , Patient's Chart, lab work & pertinent test results  History of Anesthesia Complications (+) PONV, DIFFICULT AIRWAY and history of anesthetic complications  Airway Mallampati: IV  TM Distance: >3 FB Neck ROM: Full    Dental no notable dental hx. (+) Teeth Intact   Pulmonary neg pulmonary ROS,    Pulmonary exam normal breath sounds clear to auscultation       Cardiovascular hypertension, Pt. on medications Normal cardiovascular exam+ Valvular Problems/Murmurs AI  Rhythm:Regular Rate:Normal  Hx/o PRES   Neuro/Psych  Headaches, Anxiety    GI/Hepatic Neg liver ROS, hiatal hernia, GERD  Medicated and Controlled,  Endo/Other  negative endocrine ROS  Renal/GU negative Renal ROS   BPH    Musculoskeletal  (+) Arthritis , Osteoarthritis,  Sarcoidosis Lateral meniscus tear right knee   Abdominal   Peds  Hematology negative hematology ROS (+)   Anesthesia Other Findings   Reproductive/Obstetrics                           Anesthesia Physical Anesthesia Plan  ASA: II  Anesthesia Plan: General   Post-op Pain Management:    Induction: Intravenous  PONV Risk Score and Plan: 4 or greater and Scopolamine patch - Pre-op, Midazolam, Ondansetron, Dexamethasone and Treatment may vary due to age or medical condition  Airway Management Planned: LMA  Additional Equipment:   Intra-op Plan:   Post-operative Plan: Extubation in OR  Informed Consent: I have reviewed the patients History and Physical, chart, labs and discussed the procedure including the risks, benefits and alternatives for the proposed anesthesia with the patient or authorized  representative who has indicated his/her understanding and acceptance.     Dental advisory given  Plan Discussed with: CRNA and Surgeon  Anesthesia Plan Comments:        Anesthesia Quick Evaluation

## 2018-09-21 ENCOUNTER — Ambulatory Visit (HOSPITAL_BASED_OUTPATIENT_CLINIC_OR_DEPARTMENT_OTHER): Payer: BLUE CROSS/BLUE SHIELD | Admitting: Anesthesiology

## 2018-09-21 ENCOUNTER — Ambulatory Visit (HOSPITAL_BASED_OUTPATIENT_CLINIC_OR_DEPARTMENT_OTHER)
Admission: RE | Admit: 2018-09-21 | Discharge: 2018-09-21 | Disposition: A | Discharge status: Home / Self Care | Payer: BLUE CROSS/BLUE SHIELD | Attending: Orthopedic Surgery | Admitting: Orthopedic Surgery

## 2018-09-21 ENCOUNTER — Other Ambulatory Visit: Payer: Self-pay

## 2018-09-21 ENCOUNTER — Encounter (HOSPITAL_BASED_OUTPATIENT_CLINIC_OR_DEPARTMENT_OTHER)
Admission: RE | Disposition: A | Discharge status: Home / Self Care | Payer: Self-pay | Source: Home / Self Care | Attending: Orthopedic Surgery

## 2018-09-21 ENCOUNTER — Encounter (HOSPITAL_BASED_OUTPATIENT_CLINIC_OR_DEPARTMENT_OTHER): Payer: Self-pay | Admitting: *Deleted

## 2018-09-21 DIAGNOSIS — I10 Essential (primary) hypertension: Secondary | ICD-10-CM | POA: Diagnosis not present

## 2018-09-21 DIAGNOSIS — K219 Gastro-esophageal reflux disease without esophagitis: Secondary | ICD-10-CM | POA: Diagnosis not present

## 2018-09-21 DIAGNOSIS — X58XXXA Exposure to other specified factors, initial encounter: Secondary | ICD-10-CM | POA: Insufficient documentation

## 2018-09-21 DIAGNOSIS — S83231A Complex tear of medial meniscus, current injury, right knee, initial encounter: Secondary | ICD-10-CM | POA: Insufficient documentation

## 2018-09-21 DIAGNOSIS — Z79899 Other long term (current) drug therapy: Secondary | ICD-10-CM | POA: Diagnosis not present

## 2018-09-21 DIAGNOSIS — M1990 Unspecified osteoarthritis, unspecified site: Secondary | ICD-10-CM | POA: Diagnosis not present

## 2018-09-21 DIAGNOSIS — N400 Enlarged prostate without lower urinary tract symptoms: Secondary | ICD-10-CM | POA: Insufficient documentation

## 2018-09-21 DIAGNOSIS — M94261 Chondromalacia, right knee: Secondary | ICD-10-CM | POA: Diagnosis present

## 2018-09-21 DIAGNOSIS — M2241 Chondromalacia patellae, right knee: Secondary | ICD-10-CM | POA: Insufficient documentation

## 2018-09-21 DIAGNOSIS — E559 Vitamin D deficiency, unspecified: Secondary | ICD-10-CM | POA: Insufficient documentation

## 2018-09-21 DIAGNOSIS — S83241A Other tear of medial meniscus, current injury, right knee, initial encounter: Secondary | ICD-10-CM | POA: Diagnosis present

## 2018-09-21 HISTORY — PX: KNEE ARTHROSCOPY WITH MEDIAL MENISECTOMY: SHX5651

## 2018-09-21 HISTORY — DX: Other tear of lateral meniscus, current injury, unspecified knee, initial encounter: S83.289A

## 2018-09-21 HISTORY — PX: KNEE ARTHROSCOPY: SHX127

## 2018-09-21 SURGERY — ARTHROSCOPY, KNEE
Anesthesia: General | Site: Knee | Laterality: Right

## 2018-09-21 MED ORDER — LIDOCAINE 2% (20 MG/ML) 5 ML SYRINGE
INTRAMUSCULAR | Status: AC
  Filled 2018-09-21: qty 5

## 2018-09-21 MED ORDER — BUPIVACAINE HCL (PF) 0.5 % IJ SOLN
INTRAMUSCULAR | Status: DC | PRN
  Administered 2018-09-21: 20 mL

## 2018-09-21 MED ORDER — DEXAMETHASONE SODIUM PHOSPHATE 10 MG/ML IJ SOLN
INTRAMUSCULAR | Status: DC | PRN
  Administered 2018-09-21: 10 mg via INTRAVENOUS

## 2018-09-21 MED ORDER — LACTATED RINGERS IV SOLN
INTRAVENOUS | Status: DC
  Administered 2018-09-21 (×2): via INTRAVENOUS

## 2018-09-21 MED ORDER — CHLORHEXIDINE GLUCONATE 4 % EX LIQD: 60.0000 mL | Freq: Once | CUTANEOUS | Status: DC

## 2018-09-21 MED ORDER — FENTANYL CITRATE (PF) 100 MCG/2ML IJ SOLN
50.0000 ug | INTRAMUSCULAR | Status: DC | PRN
  Administered 2018-09-21 (×2): 50 ug via INTRAVENOUS

## 2018-09-21 MED ORDER — FENTANYL CITRATE (PF) 100 MCG/2ML IJ SOLN
25.0000 ug | INTRAMUSCULAR | Status: DC | PRN
  Administered 2018-09-21: 50 ug via INTRAVENOUS
  Administered 2018-09-21: 25 ug via INTRAVENOUS

## 2018-09-21 MED ORDER — PROMETHAZINE HCL 25 MG/ML IJ SOLN: 6.2500 mg | INTRAMUSCULAR | Status: DC | PRN

## 2018-09-21 MED ORDER — FENTANYL CITRATE (PF) 100 MCG/2ML IJ SOLN
INTRAMUSCULAR | Status: AC
  Filled 2018-09-21: qty 2

## 2018-09-21 MED ORDER — SCOPOLAMINE 1 MG/3DAYS TD PT72
MEDICATED_PATCH | TRANSDERMAL | Status: AC
  Filled 2018-09-21: qty 1

## 2018-09-21 MED ORDER — HYDROCODONE-ACETAMINOPHEN 7.5-325 MG PO TABS: 1.0000 | ORAL_TABLET | Freq: Once | ORAL | Status: DC | PRN

## 2018-09-21 MED ORDER — DEXAMETHASONE SODIUM PHOSPHATE 10 MG/ML IJ SOLN
INTRAMUSCULAR | Status: AC
  Filled 2018-09-21: qty 1

## 2018-09-21 MED ORDER — ONDANSETRON HCL 4 MG/2ML IJ SOLN
INTRAMUSCULAR | Status: AC
  Filled 2018-09-21: qty 2

## 2018-09-21 MED ORDER — HYDROCODONE-ACETAMINOPHEN 5-325 MG PO TABS: 1.0000 | ORAL_TABLET | Freq: Four times a day (QID) | ORAL | 0 refills | Status: DC | PRN

## 2018-09-21 MED ORDER — PROPOFOL 10 MG/ML IV BOLUS
INTRAVENOUS | Status: DC | PRN
  Administered 2018-09-21: 200 mg via INTRAVENOUS

## 2018-09-21 MED ORDER — MIDAZOLAM HCL 2 MG/2ML IJ SOLN
1.0000 mg | INTRAMUSCULAR | Status: DC | PRN
  Administered 2018-09-21: 2 mg via INTRAVENOUS

## 2018-09-21 MED ORDER — CEFAZOLIN SODIUM-DEXTROSE 2-3 GM-%(50ML) IV SOLR
INTRAVENOUS | Status: DC | PRN
  Administered 2018-09-21: 2 g via INTRAVENOUS

## 2018-09-21 MED ORDER — CEFAZOLIN SODIUM-DEXTROSE 2-4 GM/100ML-% IV SOLN
INTRAVENOUS | Status: AC
  Filled 2018-09-21: qty 100

## 2018-09-21 MED ORDER — LIDOCAINE HCL (CARDIAC) PF 100 MG/5ML IV SOSY
PREFILLED_SYRINGE | INTRAVENOUS | Status: DC | PRN
  Administered 2018-09-21: 100 mg via INTRAVENOUS

## 2018-09-21 MED ORDER — CEFAZOLIN SODIUM-DEXTROSE 2-4 GM/100ML-% IV SOLN: 2.0000 g | INTRAVENOUS | Status: DC

## 2018-09-21 MED ORDER — PROPOFOL 10 MG/ML IV BOLUS
INTRAVENOUS | Status: AC
  Filled 2018-09-21: qty 40

## 2018-09-21 MED ORDER — MEPERIDINE HCL 25 MG/ML IJ SOLN: 6.2500 mg | INTRAMUSCULAR | Status: DC | PRN

## 2018-09-21 MED ORDER — MIDAZOLAM HCL 2 MG/2ML IJ SOLN
INTRAMUSCULAR | Status: AC
  Filled 2018-09-21: qty 2

## 2018-09-21 MED ORDER — SCOPOLAMINE 1 MG/3DAYS TD PT72: 1.0000 | MEDICATED_PATCH | Freq: Once | TRANSDERMAL | Status: DC | PRN

## 2018-09-21 MED ORDER — BUPIVACAINE HCL (PF) 0.5 % IJ SOLN
INTRAMUSCULAR | Status: AC
  Filled 2018-09-21: qty 30

## 2018-09-21 MED ORDER — SCOPOLAMINE 1 MG/3DAYS TD PT72
1.0000 | MEDICATED_PATCH | TRANSDERMAL | Status: DC
  Administered 2018-09-21: 1.5 mg via TRANSDERMAL

## 2018-09-21 SURGICAL SUPPLY — 36 items
BANDAGE ACE 6X5 VEL STRL LF (GAUZE/BANDAGES/DRESSINGS) ×3 IMPLANT
BLADE 4.2CUDA (BLADE) IMPLANT
BLADE GREAT WHITE 4.2 (BLADE) ×2 IMPLANT
BLADE GREAT WHITE 4.2MM (BLADE) ×1
COVER WAND RF STERILE (DRAPES) IMPLANT
CUTTER MENISCUS  4.2MM (BLADE)
CUTTER MENISCUS 4.2MM (BLADE) IMPLANT
DRAPE ARTHROSCOPY W/POUCH 90 (DRAPES) ×3 IMPLANT
DRSG EMULSION OIL 3X3 NADH (GAUZE/BANDAGES/DRESSINGS) ×3 IMPLANT
DURAPREP 26ML APPLICATOR (WOUND CARE) ×3 IMPLANT
ELECT MENISCUS 165MM 90D (ELECTRODE) IMPLANT
ELECT REM PT RETURN 9FT ADLT (ELECTROSURGICAL)
ELECTRODE REM PT RTRN 9FT ADLT (ELECTROSURGICAL) IMPLANT
GAUZE SPONGE 4X4 12PLY STRL (GAUZE/BANDAGES/DRESSINGS) ×3 IMPLANT
GLOVE BIOGEL PI IND STRL 8 (GLOVE) ×2 IMPLANT
GLOVE BIOGEL PI INDICATOR 8 (GLOVE) ×4
GLOVE ECLIPSE 7.5 STRL STRAW (GLOVE) ×6 IMPLANT
GOWN STRL REUS W/ TWL LRG LVL3 (GOWN DISPOSABLE) ×1 IMPLANT
GOWN STRL REUS W/ TWL XL LVL3 (GOWN DISPOSABLE) ×1 IMPLANT
GOWN STRL REUS W/TWL LRG LVL3 (GOWN DISPOSABLE) ×2
GOWN STRL REUS W/TWL XL LVL3 (GOWN DISPOSABLE) ×5 IMPLANT
HOLDER KNEE FOAM BLUE (MISCELLANEOUS) ×3 IMPLANT
KNEE WRAP E Z 3 GEL PACK (MISCELLANEOUS) ×3 IMPLANT
MANIFOLD NEPTUNE II (INSTRUMENTS) IMPLANT
NDL SAFETY ECLIPSE 18X1.5 (NEEDLE) IMPLANT
NEEDLE HYPO 18GX1.5 SHARP (NEEDLE)
PACK ARTHROSCOPY DSU (CUSTOM PROCEDURE TRAY) ×3 IMPLANT
PACK BASIN DAY SURGERY FS (CUSTOM PROCEDURE TRAY) ×3 IMPLANT
PAD CAST 4YDX4 CTTN HI CHSV (CAST SUPPLIES) ×1 IMPLANT
PADDING CAST COTTON 4X4 STRL (CAST SUPPLIES) ×2
PENCIL BUTTON HOLSTER BLD 10FT (ELECTRODE) IMPLANT
SUT ETHILON 4 0 PS 2 18 (SUTURE) IMPLANT
SYR 5ML LL (SYRINGE) ×3 IMPLANT
TOWEL GREEN STERILE FF (TOWEL DISPOSABLE) ×3 IMPLANT
TUBING ARTHRO INFLOW-ONLY STRL (TUBING) ×3 IMPLANT
WATER STERILE IRR 1000ML POUR (IV SOLUTION) ×3 IMPLANT

## 2018-09-21 NOTE — Transfer of Care (Signed)
Immediate Anesthesia Transfer of Care Note  Patient: Daniel Norman  Procedure(s) Performed: RIGHT KNEE ARTHROSCOPY WITH CHONDROPLASTY (Right Knee) KNEE ARTHROSCOPY WITH PARTIAL  MEDIAL MENISECTOMY (Right Knee)  Patient Location: PACU  Anesthesia Type:General  Level of Consciousness: awake, alert  and oriented  Airway & Oxygen Therapy: Patient Spontanous Breathing and Patient connected to face mask oxygen  Post-op Assessment: Report given to RN and Post -op Vital signs reviewed and stable  Post vital signs: Reviewed and stable  Last Vitals:  Vitals Value Taken Time  BP 128/83 09/21/2018  8:20 AM  Temp    Pulse 90 09/21/2018  8:21 AM  Resp 12 09/21/2018  8:21 AM  SpO2 99 % 09/21/2018  8:21 AM  Vitals shown include unvalidated device data.  Last Pain:  Vitals:   09/21/18 2993  TempSrc: Oral         Complications: No apparent anesthesia complications

## 2018-09-21 NOTE — Discharge Instructions (Signed)
POST-OP KNEE ARTHROSCOPY INSTRUCTIONS  °Dr. John Graves/Jim Bethune PA-C ° °Pain °You will be expected to have a moderate amount of pain in the affected knee for approximately two weeks. However, the first two days will be the most severe pain. A prescription has been provided to take as needed for the pain. The pain can be reduced by applying ice packs to the knee for the first 1-2 weeks post surgery. Also, keeping the leg elevated on pillows will help alleviate the pain. If you develop any acute pain or swelling in your calf muscle, please call the doctor. ° °Activity °It is preferred that you stay at bed rest for approximately 24 hours. However, you may go to the bathroom with help. Weight bearing as tolerated. You may begin the knee exercises the day of surgery. Discontinue crutches as the knee pain resolves. ° °Dressing °Keep the dressing dry. If the ace bandage should wrinkle or roll up, this can be rewrapped to prevent ridges in the bandage. You may remove all dressings in 48 hours,  apply bandaids to each wound. You may shower on the 4th day after surgery but no tub bath. ° °Symptoms to report to your doctor °Extreme pain °Extreme swelling °Temperature above 101 degrees °Change in the feeling, color, or movement of your toes °Redness, heat, or swelling at your incision ° °Exercise °If is preferred that as soon as possible you try to do a straight leg raise without bending the knee and concentrate on bringing the heel of your foot off the bed up to approximately 45 degrees and hold for the count of 10 seconds. Repeat this at least 10 times three or four times per day. Additional exercises are provided below. ° °You are encouraged to bend the knee as tolerated. ° °Follow-Up °Call to schedule a follow-up appointment in 5-7 days. Our office # is [phone]. ° °POST-OP EXERCISES ° °Short Arc Quads ° °1. Lie on back with legs straight. Place towel roll under thigh, just above knee. °2. Tighten thigh muscles to  straighten knee and lift heel off bed. °3. Hold for slow count of five, then lower. °4. Do three sets of ten ° ° ° °Straight Leg Raises ° °1. Lie on back with operative leg straight and other leg bent. °2. Keeping operative leg completely straight, slowly lift operative leg so foot is 5 inches off bed. °3. Hold for slow count of five, then lower. °4. Do three sets of ten. ° ° ° °DO BOTH EXERCISES 2 TIMES A DAY ° °Ankle Pumps ° °Work/move the operative ankle and foot up and down 10 times every hour while awake. ° ° °Post Anesthesia Home Care Instructions ° °Activity: °Get plenty of rest for the remainder of the day. A responsible individual must stay with you for 24 hours following the procedure.  °For the next 24 hours, DO NOT: °-Drive a car °-Operate machinery °-Drink alcoholic beverages °-Take any medication unless instructed by your physician °-Make any legal decisions or sign important papers. ° °Meals: °Start with liquid foods such as gelatin or soup. Progress to regular foods as tolerated. Avoid greasy, spicy, heavy foods. If nausea and/or vomiting occur, drink only clear liquids until the nausea and/or vomiting subsides. Call your physician if vomiting continues. ° °Special Instructions/Symptoms: °Your throat may feel dry or sore from the anesthesia or the breathing tube placed in your throat during surgery. If this causes discomfort, gargle with warm salt water. The discomfort should disappear within 24 hours. ° °If you   had a scopolamine patch placed behind your ear for the management of post- operative nausea and/or vomiting: ° °1. The medication in the patch is effective for 72 hours, after which it should be removed.  Wrap patch in a tissue and discard in the trash. Wash hands thoroughly with soap and water. °2. You may remove the patch earlier than 72 hours if you experience unpleasant side effects which may include dry mouth, dizziness or visual disturbances. °3. Avoid touching the patch. Wash your  hands with soap and water after contact with the patch. °  ° °
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
## 2018-09-21 NOTE — Anesthesia Postprocedure Evaluation (Signed)
Anesthesia Post Note  Patient: Daniel Norman  Procedure(s) Performed: RIGHT KNEE ARTHROSCOPY WITH CHONDROPLASTY (Right Knee) KNEE ARTHROSCOPY WITH PARTIAL  MEDIAL MENISECTOMY (Right Knee)     Patient location during evaluation: PACU Anesthesia Type: General Level of consciousness: awake and alert and oriented Pain management: pain level controlled Vital Signs Assessment: post-procedure vital signs reviewed and stable Respiratory status: spontaneous breathing, nonlabored ventilation and respiratory function stable Cardiovascular status: blood pressure returned to baseline and stable Postop Assessment: no apparent nausea or vomiting Anesthetic complications: no    Last Vitals:  Vitals:   09/21/18 0820 09/21/18 0830  BP: 128/83 (!) 134/94  Pulse: 91 76  Resp:  10  Temp: 36.5 C   SpO2: 98% 100%    Last Pain:  Vitals:   09/21/18 0830  TempSrc:   PainSc: 4                  Jemuel Laursen A.

## 2018-09-21 NOTE — Addendum Note (Signed)
Addendum  created 09/21/18 1232 by Mal Amabile, MD   Clinical Note Signed

## 2018-09-21 NOTE — Anesthesia Procedure Notes (Signed)
Procedure Name: LMA Insertion Performed by: Karen Kitchens, CRNA Pre-anesthesia Checklist: Patient identified, Emergency Drugs available, Suction available, Patient being monitored and Timeout performed Oxygen Delivery Method: Circle system utilized Preoxygenation: Pre-oxygenation with 100% oxygen Induction Type: IV induction LMA: LMA inserted LMA Size: 4.0 Number of attempts: 1 Placement Confirmation: positive ETCO2,  CO2 detector and breath sounds checked- equal and bilateral Tube secured with: Tape Dental Injury: Teeth and Oropharynx as per pre-operative assessment

## 2018-09-21 NOTE — Brief Op Note (Signed)
09/21/2018  8:09 AM  PATIENT:  Daniel Norman  58 y.o. male  PRE-OPERATIVE DIAGNOSIS:  LATERAL MENISCUS TEAR RIGHT KNEE  POST-OPERATIVE DIAGNOSIS:  * No post-op diagnosis entered *  PROCEDURE:  Procedure(s): RIGHT KNEE ARTHROSCOPY WITH CHONDROPLASTY (Right) KNEE ARTHROSCOPY WITH PARTIAL  MEDIAL MENISECTOMY (Right)  SURGEON:  Surgeon(s) and Role:    Jodi Geralds, MD - Primary  PHYSICIAN ASSISTANT:   ASSISTANTS: bethune   ANESTHESIA:   general  EBL:  none   BLOOD ADMINISTERED:none  DRAINS: none   LOCAL MEDICATIONS USED:  MARCAINE     SPECIMEN:  No Specimen  DISPOSITION OF SPECIMEN:  N/A  COUNTS:  YES  TOURNIQUET:  * No tourniquets in log *  DICTATION: .Other Dictation: Dictation Number [id number]  PLAN OF CARE: Discharge to home after PACU  PATIENT DISPOSITION:  PACU - hemodynamically stable.   Delay start of Pharmacological VTE agent (>24hrs) due to surgical blood loss or risk of bleeding: no

## 2018-09-21 NOTE — Op Note (Signed)
NAME: Daniel Norman, Daniel Norman MEDICAL RECORD [id number] ACCOUNT [id number] DATE OF BIRTH:[birth year] FACILITY: [facility] LOCATION: [facility] PHYSICIAN:Jacquan Savas L. Jynesis Nakamura, MD  OPERATIVE REPORT  DATE OF PROCEDURE:  09/21/2018  PREOPERATIVE DIAGNOSIS:   Medial meniscal tear.  POSTOPERATIVE DIAGNOSES: 1.  Medial meniscal tear. 2.  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  PROCEDURES: 1.  Partial medial meniscectomy. 2.  Chondroplasty medial, lateral, and patellofemoral.  SURGEON:  Jodi Geralds, MD  ASSISTANT:  Gus Puma PA-C, who was present for the entire case and assisted by manipulation of the knee and closing to minimize OR time.  BRIEF HISTORY:  The patient is a 58 year old male with a long history of significant complaints of bilateral knee pain.  He had a left knee scope previously and had excellent results, had a significant medial meniscal tear and did well.  Right after his  surgery, his right knee began bothering in a similar way was as his left.  We talked about treatment options including MRI versus going straight to arthroscopy.  Given his excellent result with his previous arthroscopy, he elected to go straight  arthroscopy.  He was brought to the operating room for this procedure.  DESCRIPTION OF PROCEDURE:  The patient brought to the operating room after adequate anesthesia was obtained with a general anesthetic,  The patient was brought to the operating table.  The right leg was then prepped and draped in the usual sterile  fashion.  Following this, routine arthroscopic examination of knee revealed there was obvious medial meniscal tear with complex component going in the anterior and posterior direction.  This was debrided back to a smooth stable rim.  There was actually  an interestingly and fairly partial tear of his ACL fibers.  It was really just maybe 10% of the fibers, but they were detached from the anteromedial direction  and these were debrided.  At this time, attention was turned to the lateral compartment.   Lateral meniscus was probed and felt to be normal lateral femoral condyle, unfortunately, did have some grade III changes and this was debrided back to a smooth and stable rim.  Once this was done, attention was turned back to patellofemoral joint where  there was some significant chondromalacia of the patellofemoral joint, which was debrided.  There was also significant chondromalacia of the medial femoral condyle which was debrided.  After debridement of these areas with a shaver, the knee was  irrigated and suctioned dry.    A sterile compressive dressing was applied and patient was taken to recovery and was noted to be in satisfactory condition.    Estimated blood loss for procedure was minimal.  AN/NUANCE  D:09/21/2018 T:09/21/2018 JOB:[id number]

## 2018-09-22 ENCOUNTER — Encounter (HOSPITAL_BASED_OUTPATIENT_CLINIC_OR_DEPARTMENT_OTHER): Payer: Self-pay | Admitting: Orthopedic Surgery

## 2019-09-03 HISTORY — PX: ROTATOR CUFF REPAIR: SHX139

## 2021-07-06 ENCOUNTER — Encounter (HOSPITAL_BASED_OUTPATIENT_CLINIC_OR_DEPARTMENT_OTHER): Payer: Self-pay

## 2021-07-06 ENCOUNTER — Emergency Department (HOSPITAL_BASED_OUTPATIENT_CLINIC_OR_DEPARTMENT_OTHER)
Admission: EM | Admit: 2021-07-06 | Discharge: 2021-07-06 | Disposition: A | Discharge status: Home / Self Care | Payer: 59 | Attending: Emergency Medicine | Admitting: Emergency Medicine

## 2021-07-06 ENCOUNTER — Other Ambulatory Visit: Payer: Self-pay

## 2021-07-06 DIAGNOSIS — Z79899 Other long term (current) drug therapy: Secondary | ICD-10-CM | POA: Insufficient documentation

## 2021-07-06 DIAGNOSIS — Z96653 Presence of artificial knee joint, bilateral: Secondary | ICD-10-CM | POA: Diagnosis not present

## 2021-07-06 DIAGNOSIS — R339 Retention of urine, unspecified: Secondary | ICD-10-CM | POA: Diagnosis not present

## 2021-07-06 DIAGNOSIS — I10 Essential (primary) hypertension: Secondary | ICD-10-CM | POA: Diagnosis not present

## 2021-07-06 LAB — CBC WITH DIFFERENTIAL/PLATELET
Abs Immature Granulocytes: 0.06 10*3/uL (ref 0.00–0.07)
Basophils Absolute: 0.1 10*3/uL (ref 0.0–0.1)
Basophils Relative: 1 %
Eosinophils Absolute: 0.2 10*3/uL (ref 0.0–0.5)
Eosinophils Relative: 3 %
HCT: 41.6 % (ref 39.0–52.0)
Hemoglobin: 14.4 g/dL (ref 13.0–17.0)
Immature Granulocytes: 1 %
Lymphocytes Relative: 22 %
Lymphs Abs: 1.6 10*3/uL (ref 0.7–4.0)
MCH: 28.2 pg (ref 26.0–34.0)
MCHC: 34.6 g/dL (ref 30.0–36.0)
MCV: 81.4 fL (ref 80.0–100.0)
Monocytes Absolute: 0.7 10*3/uL (ref 0.1–1.0)
Monocytes Relative: 10 %
Neutro Abs: 4.6 10*3/uL (ref 1.7–7.7)
Neutrophils Relative %: 63 %
Platelets: 299 10*3/uL (ref 150–400)
RBC: 5.11 MIL/uL (ref 4.22–5.81)
RDW: 11.7 % (ref 11.5–15.5)
WBC: 7.2 10*3/uL (ref 4.0–10.5)
nRBC: 0 % (ref 0.0–0.2)

## 2021-07-06 LAB — BASIC METABOLIC PANEL
Anion gap: 11 (ref 5–15)
BUN: 19 mg/dL (ref 8–23)
CO2: 23 mmol/L (ref 22–32)
Calcium: 9.5 mg/dL (ref 8.9–10.3)
Chloride: 106 mmol/L (ref 98–111)
Creatinine, Ser: 1.32 mg/dL — ABNORMAL HIGH (ref 0.61–1.24)
GFR, Estimated: >60 mL/min (ref >60)
Glucose, Bld: 116 mg/dL — ABNORMAL HIGH (ref 70–99)
Potassium: 3.7 mmol/L (ref 3.5–5.1)
Sodium: 140 mmol/L (ref 135–145)

## 2021-07-06 LAB — URINALYSIS, ROUTINE W REFLEX MICROSCOPIC
Bilirubin Urine: NEGATIVE
Glucose, UA: NEGATIVE mg/dL
Hgb urine dipstick: NEGATIVE
Ketones, ur: NEGATIVE mg/dL
Leukocytes,Ua: NEGATIVE
Nitrite: NEGATIVE
Protein, ur: NEGATIVE mg/dL
Specific Gravity, Urine: 1.012 (ref 1.005–1.030)
pH: 5 (ref 5.0–8.0)

## 2021-07-06 NOTE — ED Triage Notes (Signed)
  Patient comes in with urinary retention that has been going on over the past 12 hours.  Patient was seen at [facility] u[facility] yesterday and had catheter removed that had been in place for 20 days.  Patient was able to urinate yesterday but has not been able to since midnight tonight.  Pain 9/10, pressure in lower abdomen.
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
## 2021-07-08 LAB — URINE CULTURE
Culture: >=100000 — AB
Special Requests: NORMAL

## 2021-07-09 ENCOUNTER — Telehealth: Payer: Self-pay | Admitting: Emergency Medicine

## 2021-07-09 NOTE — Telephone Encounter (Signed)
Post ED Visit - Positive Culture Follow-up  Culture report reviewed by antimicrobial stewardship pharmacist: [facility] []  , Pharm.D. []  Enzo Bi, Pharm.D., BCPS AQ-ID []  , Pharm.D., BCPS []  Celedonio Miyamoto, Pharm.D., BCPS []  [place], Garvin Fila.D., BCPS, AAHIVP []  , Pharm.D., BCPS, AAHIVP []  Georgina Pillion, PharmD, BCPS []  , PharmD, BCPS []  [place], PharmD, BCPS [x]  [address], PharmD []  , PharmD, BCPS []  Estella Husk, PharmD  Pharmacy Team []  Lysle Pearl, PharmD []  , PharmD []  Phillips Climes, PharmD []  , Rph []  Agapito Games) , PharmD []  Daylene Posey, PharmD []  , PharmD []  Mervyn Gay, PharmD []  , PharmD []  Vinnie Level, PharmD []  [facility], PharmD []  , PharmD []  Len Childs, PharmD   Positive urine culture No further patient follow-up is required at this time.  Thaddeus Evitts 07/09/2021, 10:25 AM

## 2021-07-16 ENCOUNTER — Other Ambulatory Visit: Payer: Self-pay | Admitting: Urology

## 2021-08-03 NOTE — Patient Instructions (Addendum)
DUE TO COVID-19 ONLY ONE VISITOR IS ALLOWED TO COME WITH YOU AND STAY IN THE WAITING ROOM ONLY DURING PRE OP AND PROCEDURE DAY OF SURGERY IF YOU ARE GOING HOME AFTER SURGERY. IF YOU ARE SPENDING THE NIGHT 2 PEOPLE MAY VISIT WITH YOU IN YOUR PRIVATE ROOM AFTER SURGERY UNTIL VISITING  HOURS ARE OVER AT 800 PM AND 1  VISITOR  MAY  SPEND THE NIGHT.   YOU NEED TO HAVE A COVID 19 TEST ON_12/19__THIS TEST MUST BE DONE BEFORE SURGERY,  COVID TESTING SITE  IS LOCATED AT [address], [place]. REMAIN IN YOUR CAR THIS IS A DRIVE UP TEST. AFTER YOUR COVID TEST PLEASE WEAR A MASK OUT IN PUBLIC AND SOCIAL DISTANCE AND WASH YOUR HANDS FREQUENTLY, ALSO ASK ALL YOUR CLOSE CONTACT PERSONS TO WEAR A MASK AND SOCIAL DISTANCE AND WASH THEIR HANDS FREQUENTLY ALSO.               Daniel Norman     Your procedure is scheduled on: 08/22/21   Report to [facility] Main  Entrance   Report to admitting at  6:15 AM     Call this number if you have problems the morning of surgery [phone]   Follow all instructions from Dr. Emmaline Life office for diet and bowel prep.   Remember: Do not eat food  :After Midnight 2 nights before your surgery  08/20/21,   Have clear liquids the day before surgery. Drink plenty to prevent dehydration.     You may have clear liquids  until midnight then nothing by mouth until surgery    CLEAR LIQUID DIET   Foods Allowed                                                                     Foods Excluded  Coffee and tea, regular and decaf                             liquids that you cannot  Plain Jell-O any favor except red or purple                                           see through such as: Fruit ices (not with fruit pulp)                                     milk, soups, orange juice  Iced Popsicles                                    All solid food Carbonated beverages, regular and diet                                    Cranberry, grape and apple  juices Sports drinks like Gatorade Lightly seasoned clear broth or consume(fat free) Sugar  Sample Menu Breakfast  Lunch                                     Supper Cranberry juice                    Beef broth                            Chicken broth Jell-O                                     Grape juice                           Apple juice Coffee or tea                        Jell-O                                      Popsicle                                                Coffee or tea                        Coffee or tea  _____________________________________________________________________   BRUSH YOUR TEETH MORNING OF SURGERY AND RINSE YOUR MOUTH OUT, NO CHEWING GUM CANDY OR MINTS.     Take these medicines the morning of surgery with A SIP OF WATER: Finasteride                                You may not have any metal on your body including              piercings  Do not wear jewelry,  lotions, powders or  deodorant              Men may shave face and neck.   Do not bring valuables to the hospital. [facility] IS NOT             RESPONSIBLE   FOR VALUABLES.  Contacts, dentures or bridgework may not be worn into surgery.  Leave suitcase in the car. After surgery it may be brought to your room.              [facility] - Preparing for Surgery Before surgery, you can play an important role.  Because skin is not sterile, your skin needs to be as free of germs as possible.  You can reduce the number of germs on your skin by washing with CHG (chlorahexidine gluconate) soap before surgery.  CHG is an antiseptic cleaner which kills germs and bonds with the skin to continue killing germs even after washing. Please DO NOT use if you have an allergy to CHG or antibacterial soaps.  If your skin becomes reddened/irritated stop using the CHG and inform your nurse when you arrive at Short Stay..  You may shave  your face/neck. Please follow these instructions  carefully:  1.  Shower with CHG Soap the night before surgery and the  morning of Surgery.  2.  If you choose to wash your hair, wash your hair first as usual with your  normal  shampoo.  3.  After you shampoo, rinse your hair and body thoroughly to remove the  shampoo.                            4.  Use CHG as you would any other liquid soap.  You can apply chg directly  to the skin and wash                       Gently with a scrungie or clean washcloth.  5.  Apply the CHG Soap to your body ONLY FROM THE NECK DOWN.   Do not use on face/ open                           Wound or open sores. Avoid contact with eyes, ears mouth and genitals (private parts).                       Wash face,  Genitals (private parts) with your normal soap.             6.  Wash thoroughly, paying special attention to the area where your surgery  will be performed.  7.  Thoroughly rinse your body with warm water from the neck down.  8.  DO NOT shower/wash with your normal soap after using and rinsing off  the CHG Soap.                9.  Pat yourself dry with a clean towel.            10.  Wear clean pajamas.            11.  Place clean sheets on your bed the night of your first shower and do not  sleep with pets. Day of Surgery : Do not apply any lotions/deodorants the morning of surgery.  Please wear clean clothes to the hospital/surgery center.  FAILURE TO FOLLOW THESE INSTRUCTIONS MAY RESULT IN THE CANCELLATION OF YOUR SURGERY PATIENT SIGNATURE_________________________________  NURSE SIGNATURE__________________________________  ________________________________________________________________________

## 2021-08-06 ENCOUNTER — Encounter (HOSPITAL_COMMUNITY): Payer: Self-pay

## 2021-08-06 ENCOUNTER — Encounter (HOSPITAL_COMMUNITY)
Admission: RE | Admit: 2021-08-06 | Discharge: 2021-08-06 | Disposition: A | Discharge status: Home / Self Care | Payer: 59 | Source: Ambulatory Visit | Attending: Urology | Admitting: Urology

## 2021-08-06 ENCOUNTER — Other Ambulatory Visit: Payer: Self-pay

## 2021-08-06 VITALS — BP 156/81 | HR 75 | Temp 97.9°F | Resp 18 | Ht 73.0 in | Wt 225.0 lb

## 2021-08-06 DIAGNOSIS — Z01818 Encounter for other preprocedural examination: Secondary | ICD-10-CM | POA: Insufficient documentation

## 2021-08-06 DIAGNOSIS — I10 Essential (primary) hypertension: Secondary | ICD-10-CM | POA: Diagnosis not present

## 2021-08-06 LAB — BASIC METABOLIC PANEL
Anion gap: 8 (ref 5–15)
BUN: 15 mg/dL (ref 8–23)
CO2: 25 mmol/L (ref 22–32)
Calcium: 9.4 mg/dL (ref 8.9–10.3)
Chloride: 104 mmol/L (ref 98–111)
Creatinine, Ser: 1.29 mg/dL — ABNORMAL HIGH (ref 0.61–1.24)
GFR, Estimated: >60 mL/min (ref >60)
Glucose, Bld: 109 mg/dL — ABNORMAL HIGH (ref 70–99)
Potassium: 4.1 mmol/L (ref 3.5–5.1)
Sodium: 137 mmol/L (ref 135–145)

## 2021-08-06 LAB — CBC
HCT: 47.8 % (ref 39.0–52.0)
Hemoglobin: 16.1 g/dL (ref 13.0–17.0)
MCH: 28.3 pg (ref 26.0–34.0)
MCHC: 33.7 g/dL (ref 30.0–36.0)
MCV: 84 fL (ref 80.0–100.0)
Platelets: 235 10*3/uL (ref 150–400)
RBC: 5.69 MIL/uL (ref 4.22–5.81)
RDW: 12.6 % (ref 11.5–15.5)
WBC: 6.9 10*3/uL (ref 4.0–10.5)
nRBC: 0 % (ref 0.0–0.2)

## 2021-08-06 NOTE — Progress Notes (Signed)
COVID test- 12/19    PCP - Dr. Dewayne Hatch Cardiologist - no  Chest x-ray - no EKG - 08/06/21 -chart Stress Test - no ECHO - 2016 done for a  syncopal episode. Cardiac Cath - no Pacemaker/ICD device last checked:NA  Sleep Study - no CPAP -   Fasting Blood Sugar - NA Checks Blood Sugar _____ times a day  Blood Thinner Instructions:NA Aspirin Instructions: Last Dose:  Anesthesia review: yes  Patient denies shortness of breath, fever, cough and chest pain at PAT appointment Pt has no problems with sarcoidosis for a long time. He has no SOB with any activities.   Patient verbalized understanding of instructions that were given to them at the PAT appointment. Patient was also instructed that they will need to review over the PAT instructions again at home before surgery. yes

## 2021-08-20 ENCOUNTER — Other Ambulatory Visit: Payer: Self-pay | Admitting: Urology

## 2021-08-21 ENCOUNTER — Encounter (HOSPITAL_COMMUNITY): Payer: Self-pay | Admitting: Urology

## 2021-08-21 LAB — SARS CORONAVIRUS 2 (TAT 6-24 HRS): SARS Coronavirus 2: NEGATIVE

## 2021-08-21 NOTE — Anesthesia Preprocedure Evaluation (Signed)
Anesthesia Evaluation  Patient identified by MRN, date of birth, ID band Patient awake  General Assessment Comment:Probable difficult airway- hx/o left mandibular condyle Fx during intubation in [place], [place]  In the 90's   Reviewed: Allergy & Precautions, NPO status , Patient's Chart, lab work & pertinent test results  History of Anesthesia Complications (+) PONV, DIFFICULT AIRWAY and history of anesthetic complications  Airway Mallampati: IV  TM Distance: >3 FB Neck ROM: Full    Dental no notable dental hx. (+) Teeth Intact   Pulmonary neg pulmonary ROS,    Pulmonary exam normal breath sounds clear to auscultation       Cardiovascular hypertension, Pt. on medications Normal cardiovascular exam+ Valvular Problems/Murmurs AI  Rhythm:Regular Rate:Normal  Normal biventricular size and systolic function.  Abnormal relaxaxtion with normal filling pressures.  Mild concentric LVH.  Trivial aortic insufficiency.  Negative bubble study.   Neuro/Psych  Headaches, Anxiety    GI/Hepatic Neg liver ROS, hiatal hernia, GERD  Medicated and Controlled,  Endo/Other  negative endocrine ROS  Renal/GU negative Renal ROS   BPH    Musculoskeletal  (+) Arthritis , Osteoarthritis,  Sarcoidosis Lateral meniscus tear right knee   Abdominal   Peds  Hematology negative hematology ROS (+)   Anesthesia Other Findings   Reproductive/Obstetrics                            Anesthesia Physical  Anesthesia Plan  ASA: 3  Anesthesia Plan: General   Post-op Pain Management: Ofirmev IV (intra-op)   Induction: Intravenous  PONV Risk Score and Plan: 4 or greater and Scopolamine patch - Pre-op, Midazolam, Ondansetron, Dexamethasone and Treatment may vary due to age or medical condition  Airway Management Planned: Oral ETT and Video Laryngoscope Planned  Additional Equipment: None  Intra-op Plan:    Post-operative Plan: Extubation in OR  Informed Consent: I have reviewed the patients History and Physical, chart, labs and discussed the procedure including the risks, benefits and alternatives for the proposed anesthesia with the patient or authorized representative who has indicated his/her understanding and acceptance.     Dental advisory given  Plan Discussed with: CRNA and Anesthesiologist  Anesthesia Plan Comments: (  )        Anesthesia Quick Evaluation

## 2021-08-22 ENCOUNTER — Observation Stay (HOSPITAL_COMMUNITY)
Admission: RE | Admit: 2021-08-22 | Discharge: 2021-08-23 | Disposition: A | Discharge status: Home / Self Care | Payer: 59 | Attending: Urology | Admitting: Urology

## 2021-08-22 ENCOUNTER — Ambulatory Visit (HOSPITAL_COMMUNITY): Payer: 59 | Admitting: Anesthesiology

## 2021-08-22 ENCOUNTER — Encounter (HOSPITAL_COMMUNITY)
Admission: RE | Disposition: A | Discharge status: Home / Self Care | Payer: Self-pay | Source: Home / Self Care | Attending: Urology

## 2021-08-22 ENCOUNTER — Encounter (HOSPITAL_COMMUNITY): Payer: Self-pay | Admitting: Urology

## 2021-08-22 ENCOUNTER — Other Ambulatory Visit: Payer: Self-pay

## 2021-08-22 DIAGNOSIS — N401 Enlarged prostate with lower urinary tract symptoms: Principal | ICD-10-CM | POA: Diagnosis present

## 2021-08-22 DIAGNOSIS — R338 Other retention of urine: Secondary | ICD-10-CM | POA: Diagnosis not present

## 2021-08-22 DIAGNOSIS — I10 Essential (primary) hypertension: Secondary | ICD-10-CM | POA: Diagnosis not present

## 2021-08-22 HISTORY — PX: XI ROBOTIC ASSISTED SIMPLE PROSTATECTOMY: SHX6713

## 2021-08-22 LAB — TYPE AND SCREEN
ABO/RH(D): O POS
Antibody Screen: NEGATIVE

## 2021-08-22 LAB — ABO/RH: ABO/RH(D): O POS

## 2021-08-22 LAB — HEMOGLOBIN AND HEMATOCRIT, BLOOD
HCT: 43.2 % (ref 39.0–52.0)
Hemoglobin: 14.7 g/dL (ref 13.0–17.0)

## 2021-08-22 SURGERY — PROSTATECTOMY, SIMPLE, ROBOT-ASSISTED
Anesthesia: General

## 2021-08-22 MED ORDER — PANTOPRAZOLE SODIUM 40 MG PO TBEC
40.0000 mg | DELAYED_RELEASE_TABLET | Freq: Every day | ORAL | Status: DC
  Administered 2021-08-22 – 2021-08-23 (×2): 40 mg via ORAL
  Filled 2021-08-22 (×2): qty 1

## 2021-08-22 MED ORDER — BELLADONNA ALKALOIDS-OPIUM 16.2-60 MG RE SUPP: 1.0000 | Freq: Four times a day (QID) | RECTAL | Status: DC | PRN

## 2021-08-22 MED ORDER — ONDANSETRON HCL 4 MG/2ML IJ SOLN
INTRAMUSCULAR | Status: DC | PRN
  Administered 2021-08-22: 4 mg via INTRAVENOUS

## 2021-08-22 MED ORDER — FENTANYL CITRATE (PF) 100 MCG/2ML IJ SOLN
INTRAMUSCULAR | Status: DC | PRN
  Administered 2021-08-22: 100 ug via INTRAVENOUS
  Administered 2021-08-22: 50 ug via INTRAVENOUS

## 2021-08-22 MED ORDER — SODIUM CHLORIDE 0.9 % IV BOLUS
1000.0000 mL | Freq: Once | INTRAVENOUS | Status: AC
  Administered 2021-08-22: 11:00:00 1000 mL via INTRAVENOUS

## 2021-08-22 MED ORDER — FENTANYL CITRATE PF 50 MCG/ML IJ SOSY
PREFILLED_SYRINGE | INTRAMUSCULAR | Status: AC
  Administered 2021-08-22: 12:00:00 50 ug via INTRAVENOUS
  Filled 2021-08-22: qty 3

## 2021-08-22 MED ORDER — LACTATED RINGERS IV SOLN: INTRAVENOUS | Status: DC

## 2021-08-22 MED ORDER — BACITRACIN-NEOMYCIN-POLYMYXIN 400-5-5000 EX OINT: 1.0000 "application " | TOPICAL_OINTMENT | Freq: Three times a day (TID) | CUTANEOUS | Status: DC | PRN

## 2021-08-22 MED ORDER — HYDROCODONE-ACETAMINOPHEN 5-325 MG PO TABS
1.0000 | ORAL_TABLET | Freq: Four times a day (QID) | ORAL | 0 refills | Status: DC | PRN
Start: 2021-08-22 — End: 2022-01-16

## 2021-08-22 MED ORDER — STERILE WATER FOR IRRIGATION IR SOLN
Status: DC | PRN
  Administered 2021-08-22: 1000 mL

## 2021-08-22 MED ORDER — ACETAMINOPHEN 160 MG/5ML PO SOLN: 325.0000 mg | ORAL | Status: DC | PRN

## 2021-08-22 MED ORDER — FENTANYL CITRATE (PF) 250 MCG/5ML IJ SOLN
INTRAMUSCULAR | Status: AC
  Filled 2021-08-22: qty 5

## 2021-08-22 MED ORDER — MIDAZOLAM HCL 5 MG/5ML IJ SOLN
INTRAMUSCULAR | Status: DC | PRN
  Administered 2021-08-22: 2 mg via INTRAVENOUS

## 2021-08-22 MED ORDER — CIPROFLOXACIN IN D5W 400 MG/200ML IV SOLN
400.0000 mg | INTRAVENOUS | Status: AC
  Administered 2021-08-22: 09:00:00 400 mg via INTRAVENOUS
  Filled 2021-08-22: qty 200

## 2021-08-22 MED ORDER — ONDANSETRON HCL 4 MG/2ML IJ SOLN: 4.0000 mg | INTRAMUSCULAR | Status: DC | PRN

## 2021-08-22 MED ORDER — ONDANSETRON HCL 4 MG/2ML IJ SOLN: 4.0000 mg | Freq: Once | INTRAMUSCULAR | Status: DC | PRN

## 2021-08-22 MED ORDER — OXYCODONE HCL 5 MG/5ML PO SOLN: 5.0000 mg | Freq: Once | ORAL | Status: DC | PRN

## 2021-08-22 MED ORDER — LOSARTAN POTASSIUM 50 MG PO TABS: 100.0000 mg | ORAL_TABLET | Freq: Every day | ORAL | Status: DC

## 2021-08-22 MED ORDER — HYDROMORPHONE HCL 1 MG/ML IJ SOLN
INTRAMUSCULAR | Status: DC | PRN
  Administered 2021-08-22: 1 mg via INTRAVENOUS
  Administered 2021-08-22: .5 mg via INTRAVENOUS

## 2021-08-22 MED ORDER — PROPOFOL 10 MG/ML IV BOLUS
INTRAVENOUS | Status: AC
  Filled 2021-08-22: qty 20

## 2021-08-22 MED ORDER — LOSARTAN POTASSIUM 50 MG PO TABS
100.0000 mg | ORAL_TABLET | Freq: Every day | ORAL | Status: DC
  Administered 2021-08-22 – 2021-08-23 (×2): 100 mg via ORAL
  Filled 2021-08-22 (×2): qty 2

## 2021-08-22 MED ORDER — SODIUM CHLORIDE (PF) 0.9 % IJ SOLN
INTRAMUSCULAR | Status: DC | PRN
  Administered 2021-08-22: 20 mL

## 2021-08-22 MED ORDER — OXYCODONE HCL 5 MG PO TABS: 5.0000 mg | ORAL_TABLET | ORAL | Status: DC | PRN

## 2021-08-22 MED ORDER — SODIUM CHLORIDE (PF) 0.9 % IJ SOLN
INTRAMUSCULAR | Status: AC
  Filled 2021-08-22: qty 20

## 2021-08-22 MED ORDER — ALPRAZOLAM 0.5 MG PO TABS: 0.5000 mg | ORAL_TABLET | Freq: Every evening | ORAL | Status: DC | PRN

## 2021-08-22 MED ORDER — MIDAZOLAM HCL 2 MG/2ML IJ SOLN
INTRAMUSCULAR | Status: AC
  Filled 2021-08-22: qty 2

## 2021-08-22 MED ORDER — DOCUSATE SODIUM 100 MG PO CAPS: 100.0000 mg | ORAL_CAPSULE | Freq: Two times a day (BID) | ORAL | Status: AC

## 2021-08-22 MED ORDER — DIPHENHYDRAMINE HCL 50 MG/ML IJ SOLN: 12.5000 mg | Freq: Four times a day (QID) | INTRAMUSCULAR | Status: DC | PRN

## 2021-08-22 MED ORDER — ORAL CARE MOUTH RINSE: 15.0000 mL | Freq: Once | OROMUCOSAL | Status: AC

## 2021-08-22 MED ORDER — BUPIVACAINE LIPOSOME 1.3 % IJ SUSP
INTRAMUSCULAR | Status: AC
  Filled 2021-08-22: qty 20

## 2021-08-22 MED ORDER — SULFAMETHOXAZOLE-TRIMETHOPRIM 800-160 MG PO TABS: 1.0000 | ORAL_TABLET | Freq: Two times a day (BID) | ORAL | 0 refills | Status: DC

## 2021-08-22 MED ORDER — DEXAMETHASONE SODIUM PHOSPHATE 10 MG/ML IJ SOLN
INTRAMUSCULAR | Status: DC | PRN
  Administered 2021-08-22: 10 mg via INTRAVENOUS

## 2021-08-22 MED ORDER — HYDROMORPHONE HCL 1 MG/ML IJ SOLN
0.5000 mg | INTRAMUSCULAR | Status: DC | PRN
Start: 2021-08-22 — End: 2021-08-23

## 2021-08-22 MED ORDER — ACETAMINOPHEN 500 MG PO TABS
1000.0000 mg | ORAL_TABLET | Freq: Four times a day (QID) | ORAL | Status: AC
  Administered 2021-08-22 – 2021-08-23 (×4): 1000 mg via ORAL
  Filled 2021-08-22 (×4): qty 2

## 2021-08-22 MED ORDER — OXYCODONE HCL 5 MG PO TABS: 5.0000 mg | ORAL_TABLET | Freq: Once | ORAL | Status: DC | PRN

## 2021-08-22 MED ORDER — SUGAMMADEX SODIUM 200 MG/2ML IV SOLN
INTRAVENOUS | Status: DC | PRN
Start: 2021-08-22 — End: 2021-08-22
  Administered 2021-08-22: 200 mg via INTRAVENOUS

## 2021-08-22 MED ORDER — ROCURONIUM BROMIDE 10 MG/ML (PF) SYRINGE
PREFILLED_SYRINGE | INTRAVENOUS | Status: DC | PRN
  Administered 2021-08-22 (×2): 10 mg via INTRAVENOUS
  Administered 2021-08-22: 80 mg via INTRAVENOUS

## 2021-08-22 MED ORDER — ACETAMINOPHEN 325 MG PO TABS: 325.0000 mg | ORAL_TABLET | ORAL | Status: DC | PRN

## 2021-08-22 MED ORDER — POLYETHYLENE GLYCOL 3350 17 GM/SCOOP PO POWD: 1.0000 | Freq: Once | ORAL | Status: DC

## 2021-08-22 MED ORDER — BUPIVACAINE LIPOSOME 1.3 % IJ SUSP
INTRAMUSCULAR | Status: DC | PRN
  Administered 2021-08-22: 20 mL

## 2021-08-22 MED ORDER — SODIUM CHLORIDE 0.9 % IR SOLN
3000.0000 mL | Status: DC
  Administered 2021-08-22: 16:00:00 3000 mL

## 2021-08-22 MED ORDER — LIDOCAINE 2% (20 MG/ML) 5 ML SYRINGE
INTRAMUSCULAR | Status: DC | PRN
  Administered 2021-08-22: 100 mg via INTRAVENOUS

## 2021-08-22 MED ORDER — CHLORHEXIDINE GLUCONATE CLOTH 2 % EX PADS
6.0000 | MEDICATED_PAD | Freq: Every day | CUTANEOUS | Status: DC
  Administered 2021-08-22 – 2021-08-23 (×2): 6 via TOPICAL

## 2021-08-22 MED ORDER — PROPOFOL 10 MG/ML IV BOLUS
INTRAVENOUS | Status: DC | PRN
  Administered 2021-08-22: 30 mg via INTRAVENOUS
  Administered 2021-08-22: 150 mg via INTRAVENOUS
  Administered 2021-08-22: 20 mg via INTRAVENOUS
  Administered 2021-08-22: 50 mg via INTRAVENOUS

## 2021-08-22 MED ORDER — CHLORHEXIDINE GLUCONATE 0.12 % MT SOLN
15.0000 mL | Freq: Once | OROMUCOSAL | Status: AC
  Administered 2021-08-22: 07:00:00 15 mL via OROMUCOSAL

## 2021-08-22 MED ORDER — DIPHENHYDRAMINE HCL 12.5 MG/5ML PO ELIX: 12.5000 mg | ORAL_SOLUTION | Freq: Four times a day (QID) | ORAL | Status: DC | PRN

## 2021-08-22 MED ORDER — CLINDAMYCIN PHOSPHATE 600 MG/50ML IV SOLN
600.0000 mg | INTRAVENOUS | Status: AC
  Administered 2021-08-22: 09:00:00 600 mg via INTRAVENOUS
  Filled 2021-08-22: qty 50

## 2021-08-22 MED ORDER — FENTANYL CITRATE PF 50 MCG/ML IJ SOSY
25.0000 ug | PREFILLED_SYRINGE | INTRAMUSCULAR | Status: DC | PRN
  Administered 2021-08-22: 12:00:00 50 ug via INTRAVENOUS
  Administered 2021-08-22: 13:00:00 25 ug via INTRAVENOUS

## 2021-08-22 MED ORDER — DOCUSATE SODIUM 100 MG PO CAPS
100.0000 mg | ORAL_CAPSULE | Freq: Two times a day (BID) | ORAL | Status: DC
  Administered 2021-08-22 – 2021-08-23 (×2): 100 mg via ORAL
  Filled 2021-08-22 (×2): qty 1

## 2021-08-22 MED ORDER — LACTATED RINGERS IR SOLN
Status: DC | PRN
  Administered 2021-08-22: 1000 mL

## 2021-08-22 MED ORDER — MEPERIDINE HCL 50 MG/ML IJ SOLN: 6.2500 mg | INTRAMUSCULAR | Status: DC | PRN

## 2021-08-22 MED ORDER — HYDROMORPHONE HCL 2 MG/ML IJ SOLN
INTRAMUSCULAR | Status: AC
  Filled 2021-08-22: qty 1

## 2021-08-22 MED ORDER — FINASTERIDE 5 MG PO TABS
5.0000 mg | ORAL_TABLET | Freq: Every day | ORAL | Status: DC
  Administered 2021-08-23: 09:00:00 5 mg via ORAL
  Filled 2021-08-22: qty 1

## 2021-08-22 MED ORDER — DEXTROSE-NACL 5-0.45 % IV SOLN
INTRAVENOUS | Status: DC
  Administered 2021-08-22: 12:00:00 1000 mL via INTRAVENOUS

## 2021-08-22 MED ORDER — ATORVASTATIN CALCIUM 10 MG PO TABS
10.0000 mg | ORAL_TABLET | Freq: Every day | ORAL | Status: DC
  Administered 2021-08-22 – 2021-08-23 (×2): 10 mg via ORAL
  Filled 2021-08-22 (×2): qty 1

## 2021-08-22 SURGICAL SUPPLY — 68 items
APPLICATOR COTTON TIP 6 STRL (MISCELLANEOUS) ×1 IMPLANT
APPLICATOR COTTON TIP 6IN STRL (MISCELLANEOUS) ×3
BAG COUNTER SPONGE SURGICOUNT (BAG) IMPLANT
BAG SURGICOUNT SPONGE COUNTING (BAG)
CATH FOLEY 2WAY SLVR 18FR 30CC (CATHETERS) ×3 IMPLANT
CATH FOLEY 3WAY 30CC 24FR (CATHETERS) ×2
CATH TIEMANN FOLEY 18FR 5CC (CATHETERS) IMPLANT
CATH URTH STD 24FR FL 3W 2 (CATHETERS) ×1 IMPLANT
CHLORAPREP W/TINT 26 (MISCELLANEOUS) ×3 IMPLANT
CLIP LIGATING HEM O LOK PURPLE (MISCELLANEOUS) IMPLANT
CLOTH BEACON ORANGE TIMEOUT ST (SAFETY) ×3 IMPLANT
COVER SURGICAL LIGHT HANDLE (MISCELLANEOUS) ×3 IMPLANT
COVER TIP SHEARS 8 DVNC (MISCELLANEOUS) ×1 IMPLANT
COVER TIP SHEARS 8MM DA VINCI (MISCELLANEOUS) ×2
CUTTER ECHEON FLEX ENDO 45 340 (ENDOMECHANICALS) ×2 IMPLANT
DECANTER SPIKE VIAL GLASS SM (MISCELLANEOUS) ×3 IMPLANT
DERMABOND ADVANCED (GAUZE/BANDAGES/DRESSINGS) ×2
DERMABOND ADVANCED .7 DNX12 (GAUZE/BANDAGES/DRESSINGS) ×1 IMPLANT
DRAIN CHANNEL RND F F (WOUND CARE) IMPLANT
DRAPE ARM DVNC X/XI (DISPOSABLE) ×4 IMPLANT
DRAPE COLUMN DVNC XI (DISPOSABLE) ×1 IMPLANT
DRAPE DA VINCI XI ARM (DISPOSABLE) ×8
DRAPE DA VINCI XI COLUMN (DISPOSABLE) ×2
DRAPE SURG IRRIG POUCH 19X23 (DRAPES) ×3 IMPLANT
DRSG TEGADERM 4X4.75 (GAUZE/BANDAGES/DRESSINGS) ×6 IMPLANT
ELECT PENCIL ROCKER SW 15FT (MISCELLANEOUS) ×3 IMPLANT
ELECT REM PT RETURN 15FT ADLT (MISCELLANEOUS) ×3 IMPLANT
GLOVE SURG ENC MOIS LTX SZ6.5 (GLOVE) ×3 IMPLANT
GLOVE SURG ENC TEXT LTX SZ7.5 (GLOVE) ×6 IMPLANT
GLOVE SURG UNDER POLY LF SZ7.5 (GLOVE) ×3 IMPLANT
GOWN STRL REUS W/TWL LRG LVL3 (GOWN DISPOSABLE) ×9 IMPLANT
HOLDER FOLEY CATH W/STRAP (MISCELLANEOUS) ×3 IMPLANT
IRRIG SUCT STRYKERFLOW 2 WTIP (MISCELLANEOUS) ×3
IRRIGATION SUCT STRKRFLW 2 WTP (MISCELLANEOUS) ×1 IMPLANT
IV LACTATED RINGERS 1000ML (IV SOLUTION) ×3 IMPLANT
KIT TURNOVER KIT A (KITS) IMPLANT
NDL INSUFFLATION 14GA 120MM (NEEDLE) ×1 IMPLANT
NEEDLE INSUFFLATION 14GA 120MM (NEEDLE) ×3 IMPLANT
PACK ROBOT UROLOGY CUSTOM (CUSTOM PROCEDURE TRAY) ×3 IMPLANT
PAD POSITIONING PINK XL (MISCELLANEOUS) ×3 IMPLANT
PENCIL SMOKE EVACUATOR (MISCELLANEOUS) IMPLANT
PORT ACCESS TROCAR AIRSEAL 12 (TROCAR) ×1 IMPLANT
PORT ACCESS TROCAR AIRSEAL 5M (TROCAR) ×2
RELOAD STAPLE 45 4.1 GRN THCK (STAPLE) IMPLANT
SEAL CANN UNIV 5-8 DVNC XI (MISCELLANEOUS) ×4 IMPLANT
SEAL XI 5MM-8MM UNIVERSAL (MISCELLANEOUS) ×8
SET TRI-LUMEN FLTR TB AIRSEAL (TUBING) ×3 IMPLANT
SOLUTION ELECTROLUBE (MISCELLANEOUS) ×3 IMPLANT
SPONGE T-LAP 4X18 ~~LOC~~+RFID (SPONGE) ×3 IMPLANT
STAPLE RELOAD 45 GRN (STAPLE) ×1 IMPLANT
STAPLE RELOAD 45MM GREEN (STAPLE) ×2
SUT ETHILON 3 0 PS 1 (SUTURE) ×3 IMPLANT
SUT MNCRL AB 4-0 PS2 18 (SUTURE) ×6 IMPLANT
SUT NOVA NAB GS-21 0 18 T12 DT (SUTURE) ×2 IMPLANT
SUT PDS AB 1 CT1 27 (SUTURE) ×6 IMPLANT
SUT V-LOC BARB 180 2/0GR6 GS22 (SUTURE) ×6
SUT VIC AB 0 CT1 27 (SUTURE) ×6
SUT VIC AB 0 CT1 27XBRD ANTBC (SUTURE) ×3 IMPLANT
SUT VIC AB 2-0 SH 27 (SUTURE) ×2
SUT VIC AB 2-0 SH 27X BRD (SUTURE) ×1 IMPLANT
SUT VICRYL 0 UR6 27IN ABS (SUTURE) ×3 IMPLANT
SUT VLOC BARB 180 ABS3/0GR12 (SUTURE) ×6
SUTURE V-LC BRB 180 2/0GR6GS22 (SUTURE) ×2 IMPLANT
SUTURE VLOC BRB 180 ABS3/0GR12 (SUTURE) ×2 IMPLANT
TOWEL OR NON WOVEN STRL DISP B (DISPOSABLE) ×3 IMPLANT
TROCAR BLADELESS OPT 5 100 (ENDOMECHANICALS) IMPLANT
TROCAR XCEL NON-BLD 5MMX100MML (ENDOMECHANICALS) ×2 IMPLANT
WATER STERILE IRR 1000ML POUR (IV SOLUTION) ×3 IMPLANT

## 2021-08-22 NOTE — Anesthesia Postprocedure Evaluation (Signed)
Anesthesia Post Note  Patient: Daniel Norman  Procedure(s) Performed: XI ROBOTIC ASSISTED SIMPLE PROSTATECTOMY     Patient location during evaluation: PACU Anesthesia Type: General Level of consciousness: awake and alert Pain management: pain level controlled Vital Signs Assessment: post-procedure vital signs reviewed and stable Respiratory status: spontaneous breathing, nonlabored ventilation, respiratory function stable and patient connected to nasal cannula oxygen Cardiovascular status: blood pressure returned to baseline and stable Postop Assessment: no apparent nausea or vomiting Anesthetic complications: no   No notable events documented.  Last Vitals:  Vitals:   08/22/21 1200 08/22/21 1205  BP: (!) 141/79   Pulse: 87   Resp: 12 12  Temp:    SpO2: 98% 100%    Last Pain:  Vitals:   08/22/21 1205  TempSrc:   PainSc: 6                  Deandria Klute

## 2021-08-22 NOTE — Brief Op Note (Signed)
08/22/2021  11:05 AM  PATIENT:  Daniel Norman  60 y.o. male  PRE-OPERATIVE DIAGNOSIS:  LARGE PROSTATE, RETENTION  POST-OPERATIVE DIAGNOSIS:  LARGE PROSTATE, RETENTION  PROCEDURE:  Procedure(s) with comments: XI ROBOTIC ASSISTED SIMPLE PROSTATECTOMY (N/A) - 3 HRS  SURGEON:  Surgeon(s) and Role:    Sebastian Ache, MD - Primary  PHYSICIAN ASSISTANT:   ASSISTANTS: Harrie Foreman PA   ANESTHESIA:   local and general  EBL:  250 mL   BLOOD ADMINISTERED:none  DRAINS:  1 - JP to bulb; 2 - Foley to gravity    LOCAL MEDICATIONS USED:  MARCAINE     SPECIMEN:  Source of Specimen:  prostate adenoma  DISPOSITION OF SPECIMEN:  PATHOLOGY  COUNTS:  YES  TOURNIQUET:  * No tourniquets in log *  DICTATION: .Other Dictation: Dictation Number [id number]  PLAN OF CARE: Admit for overnight observation  PATIENT DISPOSITION:  PACU - hemodynamically stable.   Delay start of Pharmacological VTE agent (>24hrs) due to surgical blood loss or risk of bleeding: yes

## 2021-08-22 NOTE — Discharge Instructions (Signed)
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
## 2021-08-22 NOTE — Transfer of Care (Signed)
Immediate Anesthesia Transfer of Care Note  Patient: Daniel Norman  Procedure(s) Performed: XI ROBOTIC ASSISTED SIMPLE PROSTATECTOMY  Patient Location: PACU  Anesthesia Type:General  Level of Consciousness: sedated  Airway & Oxygen Therapy: Patient Spontanous Breathing and Patient connected to face mask oxygen  Post-op Assessment: Report given to RN and Post -op Vital signs reviewed and stable  Post vital signs: Reviewed and stable  Last Vitals:  Vitals Value Taken Time  BP    Temp    Pulse 83 08/22/21 1115  Resp 9 08/22/21 1115  SpO2 97 % 08/22/21 1115  Vitals shown include unvalidated device data.  Last Pain:  Vitals:   08/22/21 0713  TempSrc:   PainSc: 0-No pain         Complications: No notable events documented.

## 2021-08-22 NOTE — Anesthesia Procedure Notes (Signed)
Procedure Name: Intubation Date/Time: 08/22/2021 8:39 AM Performed by: Lavina Hamman, CRNA Pre-anesthesia Checklist: Patient identified, Emergency Drugs available, Suction available and Patient being monitored Patient Re-evaluated:Patient Re-evaluated prior to induction Oxygen Delivery Method: Circle System Utilized Preoxygenation: Pre-oxygenation with 100% oxygen Induction Type: IV induction Ventilation: Two handed mask ventilation required and Oral airway inserted - appropriate to patient size Laryngoscope Size: Mac and 4 Grade View: Grade I Tube type: Oral Tube size: 6.5 mm Number of attempts: 1 Airway Equipment and Method: Stylet and Oral airway Placement Confirmation: ETT inserted through vocal cords under direct vision, positive ETCO2 and breath sounds checked- equal and bilateral Secured at: 24 cm Tube secured with: Tape Dental Injury: Teeth and Oropharynx as per pre-operative assessment  Difficulty Due To: Difficulty was anticipated and Difficult Airway- due to anterior larynx Comments: Two provider mask ventilation with oral airway. Anticipated difficult airway and initial attempt with  glidescope. Grade I view, but unable to pass 7.5ETT. Second attempt with bougie and 6.5ETT. Anterior airway noted on exam and difficult passage of ETT and bougie.

## 2021-08-22 NOTE — OR Nursing (Signed)
Patient came to OR with a foley catheter that was placed in Dr. Emmaline Life office.  Removed in the OR at 0840.

## 2021-08-23 ENCOUNTER — Encounter (HOSPITAL_COMMUNITY): Payer: Self-pay | Admitting: Urology

## 2021-08-23 DIAGNOSIS — N401 Enlarged prostate with lower urinary tract symptoms: Secondary | ICD-10-CM | POA: Diagnosis not present

## 2021-08-23 LAB — SURGICAL PATHOLOGY

## 2021-08-23 LAB — BASIC METABOLIC PANEL
Anion gap: 10 (ref 5–15)
BUN: 12 mg/dL (ref 8–23)
CO2: 22 mmol/L (ref 22–32)
Calcium: 8.6 mg/dL — ABNORMAL LOW (ref 8.9–10.3)
Chloride: 104 mmol/L (ref 98–111)
Creatinine, Ser: 1.2 mg/dL (ref 0.61–1.24)
GFR, Estimated: >60 mL/min (ref >60)
Glucose, Bld: 133 mg/dL — ABNORMAL HIGH (ref 70–99)
Potassium: 3.9 mmol/L (ref 3.5–5.1)
Sodium: 136 mmol/L (ref 135–145)

## 2021-08-23 LAB — CREATININE, FLUID (PLEURAL, PERITONEAL, JP DRAINAGE): Creat, Fluid: 1.4 mg/dL

## 2021-08-23 LAB — HEMOGLOBIN AND HEMATOCRIT, BLOOD
HCT: 39.5 % (ref 39.0–52.0)
Hemoglobin: 13.7 g/dL (ref 13.0–17.0)

## 2021-08-23 NOTE — Progress Notes (Signed)
Discharge instructions have been explained and taught to the patient. Patient states that he understands and has no further questions at this time. IV and JP drain have been removed and sites are clean, dry, and intact. Patient will be taken to the front entrance by wheelchair.  Sinclair Ship, RN

## 2021-08-23 NOTE — Discharge Summary (Signed)
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 Briefly, Daniel Norman is a 60 y.o. year old patient with 160g prostate with catheter dependent urinary retention. He presents for robotic simple prostatectomy.   Hospital Course: Patient tolerated the procedure well.  He was then transferred to the floor after an uneventful PACU stay.  His hospital course was uncomplicated.  He was placed in slow drip CBI overnight and his urine cleared to light pink by POD1. CBI was capped. He tolerated clears without nausea or vomiting so was advanced to a regular diet. He was ambulating independently and pain was well controlled on just oral medications. JP creatinine was negative so JP drain was removed prior to discharge. On POD#1 he had met discharge criteria: was eating a regular diet, was up and ambulating independently,  pain was well controlled, catheter was draining  well, and was ready to for discharge.   Laboratory values:  Recent Labs    08/22/21 1254 08/23/21 0500  HGB 14.7 13.7  HCT 43.2 39.5   Recent Labs    08/23/21 0500  NA 136  K 3.9  CL 104  CO2 22  GLUCOSE 133*  BUN 12  CREATININE 1.20  CALCIUM 8.6*   No results for input(s): LABPT, INR in the last 72 hours. No results for input(s): LABURIN in the last 72 hours. Results for orders placed or performed in visit on 08/20/21  SARS Coronavirus 2 (TAT 6-24 hrs)     Status: None   Collection Time: 08/20/21 12:00 AM  Result Value Ref Range Status   SARS Coronavirus 2 RESULT: NEGATIVE  Final    Comment: RESULT: NEGATIVESARS-CoV-2 INTERPRETATION:A NEGATIVE  test result means that SARS-CoV-2 RNA was not present in the specimen above the limit of detection of this test. This does not preclude a possible SARS-CoV-2 infection and should not be used as the  sole basis for patient management decisions. Negative results must be combined with clinical observations, patient history, and epidemiological information. Optimum specimen types and timing for peak viral levels during infections caused by SARS-CoV-2  have not been determined. Collection of multiple specimens or types of specimens may be necessary to detect virus. Improper specimen collection and handling, sequence variability under primers/probes, or organism present below the limit of detection may  lead to false negative results. Positive and negative predictive values of testing are highly dependent on prevalence. False negative test results are more likely when prevalence of disease is high.The expected result  is NEGATIVE.Fact S heet for  °Healthcare Providers: https://www.fda.gov/media/136111/downloadFact Sheet for Patients: https://www.fda.gov/media/136114/downloadNormal Reference Range - Negative °  ° ° °Disposition: Home ° °Discharge instruction: The patient was instructed to be ambulatory but told to refrain from heavy lifting,  strenuous activity, or driving.  ° °Discharge medications:  °Allergies as of 08/23/2021   °No Known Allergies °  ° °  °Medication List  °  ° °TAKE these medications   ° °ALPRAZolam 0.5 MG tablet °Commonly known as: XANAX °Take 0.5 mg by mouth at bedtime as needed for anxiety. °  °atorvastatin 10 MG tablet °Commonly known as: LIPITOR °Take 10 mg by mouth daily. °  °cyclobenzaprine 10 MG tablet °Commonly known as: FLEXERIL °Take 10 mg by mouth 3 (three) times daily as needed for muscle spasms. °  °docusate sodium 100 MG capsule °Commonly known as: COLACE °Take 1 capsule (100 mg total) by mouth 2 (two) times daily. °  °finasteride 5 MG tablet °Commonly known as: PROSCAR °Take 5 mg by mouth daily. °  °HYDROcodone-acetaminophen 5-325 MG tablet °Commonly known as: Norco °Take 1-2 tablets by mouth every 6 (six) hours as needed. °What changed: reasons to take this °  °losartan 100 MG tablet °Commonly known as: COZAAR °Take 100 mg by mouth daily. °  °RABEprazole 20 MG tablet °Commonly known as: ACIPHEX °Take 20 mg by mouth daily as needed (heartburn). °  °sulfamethoxazole-trimethoprim 800-160 MG tablet °Commonly known as: BACTRIM DS °Take 1 tablet by mouth 2 (two) times daily. Start the day prior to foley removal appointment °  °zolpidem 10 MG tablet °Commonly known as: AMBIEN °Take 10 mg by mouth at bedtime as needed for sleep. °  ° °  ° ° °Followup:  ° Follow-up Information   ° ° Manny, Theodore, MD Follow up on 09/04/2021.   °Specialty: Urology °Why: at 10:30 AM for MD visit and catheter removal. °Contact information: °[address] °[place] [address] °[phone] ° ° °  °  ° °  °  ° °  ° °  °

## 2021-08-23 NOTE — Op Note (Signed)
NAME: LERAY, GARVERICK MEDICAL RECORD NO: [id number] ACCOUNT NO: [id number] DATE OF BIRTH: 1961/08/04 FACILITY: [facility] LOCATION: [facility] PHYSICIAN: Alexis Frock, MD  Operative Report   DATE OF PROCEDURE: 08/22/2021  PREOPERATIVE DIAGNOSIS:  Massive prostatic hypertrophy with urinary retention.  PROCEDURE:  Robotic-assisted simple prostatectomy.  ESTIMATED BLOOD LOSS:  250 mL.  COMPLICATIONS:  None.  SPECIMENS:  Prostate adenoma for pathology.  ASSISTANT:  Bari Mantis, PA  DRAINS: 1.  Jackson-Pratt drain to bulb suction. 2.  Foley catheter to straight drain.  INDICATIONS:  The patient is a very pleasant 60 year old man with longstanding history of obstructive voiding symptoms.  He has been on maximal medical therapy.  He has developed frank urinary retention and has been catheter dependent for a number of  months.  He previously had documented good bladder function.  Evaluation revealed no evidence of stricture disease, progression of his very large prostate. Following discussion of management including chronic catheters versus self-cath versus continued  medical therapy versus surgical extirpation, and given his prostate size greater than 100 grams simple prostatectomy being the most definitive in the long term, he wished to proceed.  Informed consent was obtained and placed in the medical record.  DESCRIPTION OF PROCEDURE:  The patient being [employer] verified.  Procedure being simple prostatectomy was confirmed.  Procedure timeout was performed.  Intravenous antibiotics were administered.  General endotracheal anesthesia was induced. The  patient was placed into a low lithotomy position.  Sterile field was created, prepped and draped the patient's penis, perineum, proximal thigh using iodine and  infraxiphoid abdomen using chlorhexidine gluconate after clipper shaving.  He was further  fastened to the operating table using 3-inch tape with foam padding across the  supraxiphoid chest.  A test of steep Trendelenburg positioning was performed and was found to be suitable position.  Foley catheter was placed to straight drain.  Next, high  flow, low pressure, pneumoperitoneum was obtained using Veress technique in the supraumbilical midline away from the presumed area of prior umbilical hernia repair. His preoperative CT had shown excellent preserved fat planes in this location.  Next, an  8 mm robotic camera port was placed in the same location.  Again, this was approximately 3 fingerbreadths superior to the umbilicus.  Laparoscopic examination of the peritoneal cavity revealed no adhesions, no visceral injury.  Additional ports were  placed as follows:  Right paramedian 8 mm robotic port, right far lateral 12 mm AirSeal assistant port, right paramedian 5 mm suction port, left paramedian 8 mm robotic port, left far lateral 8 mm robotic port.  Robot was docked and passed the electronic  checks.  Next, attention was directed at development of the space of Retzius.  Incision was made lateral to the right medial umbilical ligament from the midline towards the area of the internal ring coursing along the iliac vessels towards the area of  the right ureter.  The right vas deferens was encountered and purposely left in situ.  The patient did have some very well placed preperitoneal inguinal mesh on the right side.  This was completely excluded from the area of dissection and uninterrupted.  A mirror image dissection was performed on the left side.  Anterior attachments were taken down with cautery scissors to expose the anterior base of the prostate, which was defatted to better denote the true bladder neck.  Next, apical tissue was  stripped away from the dorsal venous complex just enough to expose the structure, which was then controlled with a vascular  load stapler taking exquisite care to avoid membranous urethral injury, which did not occur.  True bladder neck was  identified,  and a small cystotomy was made with inverted U incision approximately 2 cm proximal to the true bladder neck.  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  Ureteral orifices were easily noted as well.  Next, 4  large 0 Vicryl figure-of-eight sutures were placed on the adenoma; one in the right lobe, one in the left lobe, and two in the median lobe for retraction.  The median lobe was retracted anterior-superiorly. Incision was made keeping a flap of  bladder mucosal tissue behind the median lobe and the adenoma plane was entered.  The adenoma plane was significantly inflamed likely owing to his chronic catheter.  Very careful dissection was performed posteriorly all the way to the presumed apex of  the adenoma, then was carefully carried out laterally completing the posterior dissection.  Next, the left lobe dissected away from the prostatic capsule and the base of its orientation as was the right lobe, and the small anterior component was  similarly dissected away from the prostatic capsule also taking exquisite care to avoid capsular perforation, which did not occur.  Final angle dissection was performed by placing the adenoma on maximal superior traction and then performing a butterfly  type incision along the anterior commissure.  This exposed the true apex very well, which was then released.  This completely freed up the large adenoma.  Specimen was placed in EndoCatch bag for later retrieval.  The additional hemostasis was achieved  within the fossa using point coagulation cautery, resulted in acceptable hemostasis.  Digital rectal exam was then performed using indicator glove.  There was no evidence of rectal violation noted.  Attention was then directed at mucosal reapposition.  A  double-armed 3-0 V-Loc suture was used to reapproximate the posterior 50% of the circumference of the urethra to the posterior 50% circumference of the bladder neck  at the area of the previously made bladder neck flap.  This resulted in excellent  covering of the prostatic fossa and mucosal apposition of the posterior 50% circumference of the urethra. This also allowed a bridge of mucosal tissue for the catheter to slide along. The ureteral orifices were again inspected.  They were visibly patent  with clear urine.  The cystotomy was then closed using 2 separate running suture lines of 2-0 V-Loc.  The posterior edge to the anterior edge met in the midline and tied to each other. A new final 24-French 3-way Foley catheter was then placed, 20 mL in  the balloon, irrigation port plugged.  It was irrigated quantitatively, no clots noted.  Next, a closed suction drain was brought out through the previous left lateral most robotic port site into the area of the peritoneal cavity.  Robot was then  undocked.  The previous right lateral most incision port site was closed at the level of fascia using Carter-Thomason suture passer and 0 Vicryl.  All incision sites were infiltrated with dilute lipolyzed Marcaine.  The specimen was retrieved by  extending the previous camera port site superiorly for a distance of approximately 4 cm, removing the large adenoma, setting aside for permanent pathology.  The extraction site was closed at the level of the fascia using figure-of-eight Novafil x4  followed by reapproximation of Scarpa's with running Vicryl.  All incision sites were then closed at the level of skin using subcuticular Monocryl followed by Dermabond.  The procedure was terminated.  The patient tolerated the procedure well.  No  immediate perioperative complications.  The patient was taken to postanesthesia care unit in stable condition.  Plan for observation admission.  Please note, first assistant, Bari Mantis, was crucial for all portions of the surgery today.  She provided invaluable retraction, suctioning, specimen manipulation, vascular stapling, and general first  assistance.   [facility] D: 08/22/2021 11:14:27 am T: 08/22/2021 10:48:00 pm  JOB: [id number]/ [id number]

## 2021-11-12 ENCOUNTER — Ambulatory Visit: Payer: Commercial Managed Care - HMO | Attending: Urology | Admitting: Physical Therapy

## 2021-11-12 ENCOUNTER — Encounter: Payer: Self-pay | Admitting: Physical Therapy

## 2021-11-12 ENCOUNTER — Other Ambulatory Visit: Payer: Self-pay

## 2021-11-12 DIAGNOSIS — R293 Abnormal posture: Secondary | ICD-10-CM | POA: Diagnosis present

## 2021-11-12 DIAGNOSIS — M6281 Muscle weakness (generalized): Secondary | ICD-10-CM | POA: Insufficient documentation

## 2021-11-12 DIAGNOSIS — R279 Unspecified lack of coordination: Secondary | ICD-10-CM | POA: Diagnosis present
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
## 2021-11-12 NOTE — Patient Instructions (Signed)
Bladder Irritants ? ?Certain foods and beverages can be irritating to the bladder.  Avoiding these irritants may decrease your symptoms of urinary urgency, frequency or bladder pain.  Even reducing your intake can help with your symptoms.  Not everyone is sensitive to all bladder irritants, so you may consider focusing on one irritant at a time, removing or reducing your intake of that irritant for 7-10 days to see if this change helps your symptoms.  Water intake is also very important. ? ?Below is a list of bladder irritants. ? ?Drinks: alcohol, carbonated beverages, caffeinated beverages such as coffee and tea, drinks with artificial sweeteners, citrus juices, apple juice, tomato juice ? ?Foods: tomatoes and tomato based foods, spicy food, sugar and artificial sweeteners, vinegar, chocolate, raw onion, apples, citrus fruits, pineapple, cranberries, tomatoes, strawberries, plums, peaches, cantaloupe ? ?Other: acidic urine (too concentrated) - see water intake info below ? ?Substitutes you can try that are NOT irritating to the bladder: cooked onion, pears, papayas, sun-brewed decaf teas, watermelons, non-citrus herbal teas, apricots, kava and low-acid instant drinks (Postum). ? ? ? ?WATER INTAKE: Remember to drink lots of water (aim for fluid intake of half your body weight with 2/3 of fluids being water).  You may be limiting fluids due to fear of leakage, but this can actually worsen urgency symptoms due to highly concentrated urine.  Water helps balance the pH of your urine so it doesn't become too acidic - acidic urine is a bladder irritant! ? ? ?THE KNACK ? ?The Knack is a strategy you may use to help to reduce or prevent leakage or passing of urine, gas or feces during an activity that causes downward force on the pelvic floor muscles.   ? ?Activities that can cause downward pressure on the pelvic floor muscles include coughing, sneezing, laughing, bending, lifting, and transitioning from different body  positions such as from laying down to sitting up and sitting to standing. ? ?To perform The Knack, consciously squeeze and lift your pelvic floor muscles to perform a strong, well-timed pelvic muscle contraction BEFORE AND DURING these activities above.  As your contraction gets more coordinated and your muscles get stronger, you will become more effective in controlling your experience of incontinence or gas passing during these activities.   ? ?

## 2021-11-16 ENCOUNTER — Encounter: Payer: Managed Care, Other (non HMO) | Admitting: Rehabilitative and Restorative Service Providers"

## 2021-11-20 ENCOUNTER — Other Ambulatory Visit: Payer: Self-pay

## 2021-11-20 ENCOUNTER — Ambulatory Visit: Payer: Commercial Managed Care - HMO | Admitting: Physical Therapy

## 2021-11-20 DIAGNOSIS — M6281 Muscle weakness (generalized): Secondary | ICD-10-CM | POA: Diagnosis not present

## 2021-11-20 DIAGNOSIS — R279 Unspecified lack of coordination: Secondary | ICD-10-CM

## 2021-11-20 DIAGNOSIS — R293 Abnormal posture: Secondary | ICD-10-CM
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
## 2021-11-20 NOTE — Patient Instructions (Signed)
?  THE KNACK ? ?The Knack is a strategy you may use to help to reduce or prevent leakage or passing of urine, gas or feces during an activity that causes downward force on the pelvic floor muscles.   ? ?Activities that can cause downward pressure on the pelvic floor muscles include coughing, sneezing, laughing, bending, lifting, and transitioning from different body positions such as from laying down to sitting up and sitting to standing. ? ?To perform The Knack, consciously squeeze and lift your pelvic floor muscles to perform a strong, well-timed pelvic muscle contraction BEFORE AND DURING these activities above.  As your contraction gets more coordinated and your muscles get stronger, you will become more effective in controlling your experience of incontinence or gas passing during these activities.   ? ?

## 2021-11-27 ENCOUNTER — Ambulatory Visit: Payer: Commercial Managed Care - HMO | Admitting: Physical Therapy

## 2021-11-27 ENCOUNTER — Other Ambulatory Visit: Payer: Self-pay

## 2021-11-27 DIAGNOSIS — M6281 Muscle weakness (generalized): Secondary | ICD-10-CM

## 2021-11-27 DIAGNOSIS — R279 Unspecified lack of coordination: Secondary | ICD-10-CM

## 2021-11-27 DIAGNOSIS — R293 Abnormal posture: Secondary | ICD-10-CM

## 2021-11-27 NOTE — Patient Instructions (Signed)
Penile Pump Protocol ?Prior to placing pump on penis, retract the foreskin and shave the pubic hair ?Place water based lubricant only on the ring.  ?Place pump over the penis to the base.  ?Use slow gentle compressions till erection ?Maintain erection for 5 seconds then release, So for 10-20 erections for 10 min per day for 4 weeks ?Then progress to  3 minutes on, 1 minute off, 3 repetitions of this sequence for 3 times per week up to 12 months ?For post-prostatectomy, use as early as 4 weeks post-operatively, up to 12 months ? ?Device may be covered by insurance if billed for "penile rehabilitation"  ?The device is to stretch and increase strength of penis ?You Tube vides "Physiotherapy Penile Rehab for Erectile Dysfunction ( use of vacuum pump) ? ?Types of devices ?Penile pumps ? ?Vacurect Vacuum Therapy Erectile Dysfunction Device  by Vacurect ?Can get on [place] ? ?Active Erection System ?www.FailLinks.co.uk ?  ? ?Clamp Protocol ?1.  Fit penile clamp-We[employer]ner or Dribblestop ?2. Wear all day  during the waking hours ONLY. Do not wear when sleeping. ?3. Place clamp just before the glans penis (head of penis) ?4. Wear all day, minimum of 2 hours between emptying your bladder  ?5. Wear 6 days/week, with one day off to re-assess ?6. Bladder Chart on the day off ?7. On day off see how long you can go without leaking but continue urinating 2 hours  ?8. When weaning off the clamp wear a pad just in case of leakage. ?9. Wear 4-6 weeks, , wean off pads ?10. May use in combination of medication that doctor may give you ? ? ? ? ? ? ? ?
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
## 2021-12-04 ENCOUNTER — Ambulatory Visit: Payer: Commercial Managed Care - HMO | Attending: Urology | Admitting: Physical Therapy

## 2021-12-04 DIAGNOSIS — M6281 Muscle weakness (generalized): Secondary | ICD-10-CM | POA: Diagnosis present

## 2021-12-04 DIAGNOSIS — R279 Unspecified lack of coordination: Secondary | ICD-10-CM | POA: Diagnosis present

## 2021-12-04 DIAGNOSIS — R293 Abnormal posture: Secondary | ICD-10-CM | POA: Insufficient documentation
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
## 2021-12-11 ENCOUNTER — Ambulatory Visit: Payer: Commercial Managed Care - HMO | Admitting: Physical Therapy

## 2021-12-11 DIAGNOSIS — M6281 Muscle weakness (generalized): Secondary | ICD-10-CM | POA: Diagnosis not present

## 2021-12-11 DIAGNOSIS — R279 Unspecified lack of coordination: Secondary | ICD-10-CM
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
## 2021-12-18 ENCOUNTER — Ambulatory Visit: Payer: Commercial Managed Care - HMO | Admitting: Physical Therapy

## 2021-12-18 DIAGNOSIS — M6281 Muscle weakness (generalized): Secondary | ICD-10-CM

## 2021-12-18 DIAGNOSIS — R293 Abnormal posture: Secondary | ICD-10-CM

## 2021-12-18 DIAGNOSIS — R279 Unspecified lack of coordination: Secondary | ICD-10-CM
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
## 2021-12-25 ENCOUNTER — Ambulatory Visit: Payer: Commercial Managed Care - HMO | Admitting: Physical Therapy

## 2021-12-25 DIAGNOSIS — M6281 Muscle weakness (generalized): Secondary | ICD-10-CM

## 2021-12-25 DIAGNOSIS — R293 Abnormal posture: Secondary | ICD-10-CM

## 2021-12-25 DIAGNOSIS — R279 Unspecified lack of coordination: Secondary | ICD-10-CM
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
## 2022-01-01 ENCOUNTER — Ambulatory Visit: Payer: Commercial Managed Care - HMO | Attending: Urology | Admitting: Physical Therapy

## 2022-01-01 DIAGNOSIS — R279 Unspecified lack of coordination: Secondary | ICD-10-CM | POA: Diagnosis present

## 2022-01-01 DIAGNOSIS — M6281 Muscle weakness (generalized): Secondary | ICD-10-CM | POA: Diagnosis present

## 2022-01-01 DIAGNOSIS — M62838 Other muscle spasm: Secondary | ICD-10-CM | POA: Diagnosis present

## 2022-01-01 DIAGNOSIS — R293 Abnormal posture: Secondary | ICD-10-CM | POA: Diagnosis present
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
## 2022-01-08 ENCOUNTER — Ambulatory Visit: Payer: Commercial Managed Care - HMO | Admitting: Physical Therapy

## 2022-01-08 DIAGNOSIS — R293 Abnormal posture: Secondary | ICD-10-CM

## 2022-01-08 DIAGNOSIS — M6281 Muscle weakness (generalized): Secondary | ICD-10-CM

## 2022-01-08 DIAGNOSIS — R279 Unspecified lack of coordination: Secondary | ICD-10-CM
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
## 2022-01-08 NOTE — Patient Instructions (Addendum)

## 2022-01-15 ENCOUNTER — Ambulatory Visit: Payer: Commercial Managed Care - HMO | Admitting: Physical Therapy

## 2022-01-15 DIAGNOSIS — R293 Abnormal posture: Secondary | ICD-10-CM

## 2022-01-15 DIAGNOSIS — M6281 Muscle weakness (generalized): Secondary | ICD-10-CM | POA: Diagnosis not present

## 2022-01-15 DIAGNOSIS — M62838 Other muscle spasm: Secondary | ICD-10-CM

## 2022-01-15 DIAGNOSIS — R279 Unspecified lack of coordination: Secondary | ICD-10-CM
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
## 2022-01-15 NOTE — Addendum Note (Signed)
Addended by: Barbaraann Faster on: 01/15/2022 10:28 AM ? ? Modules accepted: Orders ? ?

## 2022-01-16 ENCOUNTER — Emergency Department (HOSPITAL_BASED_OUTPATIENT_CLINIC_OR_DEPARTMENT_OTHER)
Admission: EM | Admit: 2022-01-16 | Discharge: 2022-01-16 | Disposition: A | Discharge status: Home / Self Care | Payer: Commercial Managed Care - HMO | Attending: Emergency Medicine | Admitting: Emergency Medicine

## 2022-01-16 ENCOUNTER — Emergency Department (HOSPITAL_BASED_OUTPATIENT_CLINIC_OR_DEPARTMENT_OTHER): Payer: Commercial Managed Care - HMO

## 2022-01-16 ENCOUNTER — Other Ambulatory Visit: Payer: Self-pay

## 2022-01-16 ENCOUNTER — Encounter (HOSPITAL_BASED_OUTPATIENT_CLINIC_OR_DEPARTMENT_OTHER): Payer: Self-pay

## 2022-01-16 DIAGNOSIS — N50812 Left testicular pain: Secondary | ICD-10-CM | POA: Diagnosis present

## 2022-01-16 DIAGNOSIS — D72829 Elevated white blood cell count, unspecified: Secondary | ICD-10-CM | POA: Insufficient documentation

## 2022-01-16 DIAGNOSIS — Z79899 Other long term (current) drug therapy: Secondary | ICD-10-CM | POA: Diagnosis not present

## 2022-01-16 DIAGNOSIS — I10 Essential (primary) hypertension: Secondary | ICD-10-CM | POA: Diagnosis not present

## 2022-01-16 LAB — COMPREHENSIVE METABOLIC PANEL
ALT: 32 U/L (ref 0–44)
AST: 18 U/L (ref 15–41)
Albumin: 4.4 g/dL (ref 3.5–5.0)
Alkaline Phosphatase: 57 U/L (ref 38–126)
Anion gap: 12 (ref 5–15)
BUN: 20 mg/dL (ref 8–23)
CO2: 25 mmol/L (ref 22–32)
Calcium: 10.1 mg/dL (ref 8.9–10.3)
Chloride: 104 mmol/L (ref 98–111)
Creatinine, Ser: 1.44 mg/dL — ABNORMAL HIGH (ref 0.61–1.24)
GFR, Estimated: 55 mL/min — ABNORMAL LOW (ref >60)
Glucose, Bld: 98 mg/dL (ref 70–99)
Potassium: 4 mmol/L (ref 3.5–5.1)
Sodium: 141 mmol/L (ref 135–145)
Total Bilirubin: 0.9 mg/dL (ref 0.3–1.2)
Total Protein: 7.4 g/dL (ref 6.5–8.1)

## 2022-01-16 LAB — CBC WITH DIFFERENTIAL/PLATELET
Abs Immature Granulocytes: 0.08 10*3/uL — ABNORMAL HIGH (ref 0.00–0.07)
Basophils Absolute: 0.1 10*3/uL (ref 0.0–0.1)
Basophils Relative: 0 %
Eosinophils Absolute: 0.1 10*3/uL (ref 0.0–0.5)
Eosinophils Relative: 1 %
HCT: 42.9 % (ref 39.0–52.0)
Hemoglobin: 14.8 g/dL (ref 13.0–17.0)
Immature Granulocytes: 1 %
Lymphocytes Relative: 9 %
Lymphs Abs: 1.5 10*3/uL (ref 0.7–4.0)
MCH: 28.2 pg (ref 26.0–34.0)
MCHC: 34.5 g/dL (ref 30.0–36.0)
MCV: 81.9 fL (ref 80.0–100.0)
Monocytes Absolute: 1.2 10*3/uL — ABNORMAL HIGH (ref 0.1–1.0)
Monocytes Relative: 7 %
Neutro Abs: 13.4 10*3/uL — ABNORMAL HIGH (ref 1.7–7.7)
Neutrophils Relative %: 82 %
Platelets: 288 10*3/uL (ref 150–400)
RBC: 5.24 MIL/uL (ref 4.22–5.81)
RDW: 12.4 % (ref 11.5–15.5)
WBC: 16.3 10*3/uL — ABNORMAL HIGH (ref 4.0–10.5)
nRBC: 0 % (ref 0.0–0.2)

## 2022-01-16 LAB — URINALYSIS, ROUTINE W REFLEX MICROSCOPIC
Bilirubin Urine: NEGATIVE
Glucose, UA: NEGATIVE mg/dL
Ketones, ur: NEGATIVE mg/dL
Nitrite: NEGATIVE
Specific Gravity, Urine: 1.022 (ref 1.005–1.030)
pH: 5.5 (ref 5.0–8.0)

## 2022-01-16 MED ORDER — MORPHINE SULFATE (PF) 4 MG/ML IV SOLN
4.0000 mg | Freq: Once | INTRAVENOUS | Status: AC
  Administered 2022-01-16: 4 mg via INTRAVENOUS
  Filled 2022-01-16: qty 1

## 2022-01-16 MED ORDER — HYDROMORPHONE HCL 1 MG/ML IJ SOLN
1.0000 mg | Freq: Once | INTRAMUSCULAR | Status: AC
  Administered 2022-01-16: 1 mg via INTRAVENOUS
  Filled 2022-01-16: qty 1

## 2022-01-16 MED ORDER — DOXYCYCLINE HYCLATE 100 MG PO CAPS: 100.0000 mg | ORAL_CAPSULE | Freq: Two times a day (BID) | ORAL | 0 refills | Status: DC

## 2022-01-16 MED ORDER — HYDROCODONE-ACETAMINOPHEN 5-325 MG PO TABS
1.0000 | ORAL_TABLET | ORAL | 0 refills | Status: DC | PRN
Start: 2022-01-16 — End: 2022-01-16

## 2022-01-16 MED ORDER — ONDANSETRON HCL 4 MG/2ML IJ SOLN
4.0000 mg | Freq: Once | INTRAMUSCULAR | Status: AC
  Administered 2022-01-16: 4 mg via INTRAVENOUS
  Filled 2022-01-16: qty 2

## 2022-01-16 MED ORDER — KETOROLAC TROMETHAMINE 30 MG/ML IJ SOLN
30.0000 mg | Freq: Once | INTRAMUSCULAR | Status: AC
  Administered 2022-01-16: 30 mg via INTRAVENOUS
  Filled 2022-01-16: qty 1

## 2022-01-16 MED ORDER — DOXYCYCLINE HYCLATE 100 MG PO CAPS: 100.0000 mg | ORAL_CAPSULE | Freq: Two times a day (BID) | ORAL | 0 refills | Status: AC

## 2022-01-16 MED ORDER — HYDROCODONE-ACETAMINOPHEN 5-325 MG PO TABS: 1.0000 | ORAL_TABLET | Freq: Once | ORAL | Status: DC

## 2022-01-16 MED ORDER — IBUPROFEN 600 MG PO TABS: 600.0000 mg | ORAL_TABLET | Freq: Four times a day (QID) | ORAL | 0 refills | Status: DC | PRN

## 2022-01-16 MED ORDER — HYDROCODONE-ACETAMINOPHEN 5-325 MG PO TABS: 1.0000 | ORAL_TABLET | ORAL | 0 refills | Status: AC | PRN

## 2022-01-16 MED ORDER — IBUPROFEN 600 MG PO TABS: 600.0000 mg | ORAL_TABLET | Freq: Four times a day (QID) | ORAL | 0 refills | Status: AC | PRN

## 2022-01-16 MED ORDER — IOHEXOL 300 MG/ML  SOLN
100.0000 mL | Freq: Once | INTRAMUSCULAR | Status: AC | PRN
  Administered 2022-01-16: 100 mL via INTRAVENOUS

## 2022-01-16 NOTE — ED Provider Notes (Signed)
[facility] EMERGENCY DEPT Provider Note   CSN: [id number] Arrival date & time: 01/16/22  1522     History  Chief Complaint  Patient presents with   Testicle Pain    Daniel Norman is a 61 y.o. male.  61 year old male with a past medical history of prostatectomy in December 2022 by urologist Dr. Urban Gibson presents to the ED with a chief complaint of left testicular pain for 1 week.  Patient endorses significant left testicle pain, described this as sharp, intermittent, worsened this morning.  Pain is worsened with sitting, movement, ambulation.  He has been taking ibuprofen 800 mg without any improvement in symptoms.  He did call his urologist who recommended he take medication for pain control, along with seek further evaluation in the ED if symptoms not improved.  He reports no pain with urination, no dysuria, no frequency or changes in urinary symptoms.  He has not had any fevers, no abdominal pain, no nausea or vomiting.  The history is provided by the patient and medical records.  Testicle Pain This is a new problem. The current episode started more than 1 week ago. The problem occurs constantly. The problem has been gradually worsening. Pertinent negatives include no abdominal pain and no shortness of breath. The symptoms are aggravated by walking and standing. Nothing relieves the symptoms. He has tried acetaminophen and rest for the symptoms.      Home Medications Prior to Admission medications   Medication Sig Start Date End Date Taking? Authorizing Provider  doxycycline (VIBRAMYCIN) 100 MG capsule Take 1 capsule (100 mg total) by mouth 2 (two) times daily for 7 days. 01/16/22 01/23/22 Yes Emalie Mcwethy, Leonie Douglas, PA-C  HYDROcodone-acetaminophen (NORCO/VICODIN) 5-325 MG tablet Take 1 tablet by mouth every 4 (four) hours as needed for up to 3 days. 01/16/22 01/19/22 Yes Latish Toutant, PA-C  ibuprofen (ADVIL) 600 MG tablet Take 1 tablet (600 mg total) by mouth every 6 (six) hours as  needed for up to 7 days. 01/16/22 01/23/22 Yes Randye Treichler, Leonie Douglas, PA-C  ALPRAZolam Prudy Feeler) 0.5 MG tablet Take 0.5 mg by mouth at bedtime as needed for anxiety.    [provider]  atorvastatin (LIPITOR) 10 MG tablet Take 10 mg by mouth daily. 07/04/21   [provider]  cyclobenzaprine (FLEXERIL) 10 MG tablet Take 10 mg by mouth 3 (three) times daily as needed for muscle spasms.    [provider]  docusate sodium (COLACE) 100 MG capsule Take 1 capsule (100 mg total) by mouth 2 (two) times daily. 08/22/21   Harrie Foreman, PA-C  finasteride (PROSCAR) 5 MG tablet Take 5 mg by mouth daily.    [provider]  losartan (COZAAR) 100 MG tablet Take 100 mg by mouth daily.    [provider]  RABEprazole (ACIPHEX) 20 MG tablet Take 20 mg by mouth daily as needed (heartburn).    [provider]  zolpidem (AMBIEN) 10 MG tablet Take 10 mg by mouth at bedtime as needed for sleep.    [provider]      Allergies    Patient has no known allergies.    Review of Systems   Review of Systems  Constitutional:  Negative for chills and fever.  HENT:  Negative for sore throat.   Respiratory:  Negative for shortness of breath.   Gastrointestinal:  Negative for abdominal pain, nausea and vomiting.  Genitourinary:  Positive for scrotal swelling and testicular pain. Negative for decreased urine volume, difficulty urinating and urgency.  Neurological:  Negative for light-headedness.  All other systems reviewed and are negative.  Physical Exam Updated Vital Signs BP (!) 160/89   Pulse 100   Temp 97.9 F (36.6 C)   Resp 18   Ht 6\' 1"  (1.854 m)   Wt 102.1 kg   SpO2 98%   BMI 29.69 kg/m  Physical Exam Vitals and nursing note reviewed. Exam conducted with a chaperone present.  Constitutional:      Appearance: Normal appearance.  HENT:     Head: Normocephalic and atraumatic.     Mouth/Throat:     Mouth: Mucous membranes are moist.  Eyes:      Pupils: Pupils are equal, round, and reactive to light.  Cardiovascular:     Rate and Rhythm: Normal rate.  Pulmonary:     Effort: Pulmonary effort is normal.  Abdominal:     General: Abdomen is flat.     Hernia: There is no hernia in the left inguinal area or right inguinal area.  Genitourinary:    Penis: Circumcised.      Testes:        Right: Tenderness, testicular hydrocele or varicocele not present.        Left: Tenderness and swelling present. Testicular hydrocele or varicocele not present.     Epididymis:     Left: Inflamed.  Musculoskeletal:     Cervical back: Normal range of motion and neck supple.  Skin:    General: Skin is warm and dry.  Neurological:     Mental Status: He is alert and oriented to person, place, and time.    ED Results / Procedures / Treatments   Labs (all labs ordered are listed, but only abnormal results are displayed) Labs Reviewed  CBC WITH DIFFERENTIAL/PLATELET - Abnormal; Notable for the following components:      Result Value   WBC 16.3 (*)    Neutro Abs 13.4 (*)    Monocytes Absolute 1.2 (*)    Abs Immature Granulocytes 0.08 (*)    All other components within normal limits  COMPREHENSIVE METABOLIC PANEL - Abnormal; Notable for the following components:   Creatinine, Ser 1.44 (*)    GFR, Estimated 55 (*)    All other components within normal limits  URINALYSIS, ROUTINE W REFLEX MICROSCOPIC - Abnormal; Notable for the following components:   Hgb urine dipstick TRACE (*)    Protein, ur TRACE (*)    Leukocytes,Ua TRACE (*)    Bacteria, UA RARE (*)    All other components within normal limits    EKG None  Radiology CT PELVIS W CONTRAST  Result Date: 01/16/2022 CLINICAL DATA:  Left testicular pain. EXAM: CT PELVIS WITH CONTRAST TECHNIQUE: Multidetector CT imaging of the pelvis was performed using the standard protocol following the bolus administration of intravenous contrast. RADIATION DOSE REDUCTION: This exam was performed according  to the departmental dose-optimization program which includes automated exposure control, adjustment of the mA and/or kV according to patient size and/or use of iterative reconstruction technique. CONTRAST:  01/18/2022 OMNIPAQUE IOHEXOL 300 MG/ML  SOLN COMPARISON:  Ultrasound of same day.  CT scan of August 02, 2021. FINDINGS: Urinary Tract:  No abnormality visualized. Bowel:  Unremarkable visualized pelvic bowel loops. Vascular/Lymphatic: No pathologically enlarged lymph nodes. No significant vascular abnormality seen. Reproductive:  Status post prostatectomy. Other: Small fat containing left inguinal hernia is noted. No ascites is noted. Small bilateral hydroceles as noted on scrotal ultrasound of same day. Musculoskeletal: No suspicious bone lesions identified. IMPRESSION: Small fat containing left  inguinal hernia. Small bilateral hydroceles as noted on scrotal ultrasound of same day. Electronically Signed   By: Lupita Raider M.D.   On: 01/16/2022 18:28   CT Renal Stone Study  Result Date: 01/16/2022 CLINICAL DATA:  Left-sided testicular pain EXAM: CT ABDOMEN AND PELVIS WITHOUT CONTRAST TECHNIQUE: Multidetector CT imaging of the abdomen and pelvis was performed following the standard protocol without IV contrast. RADIATION DOSE REDUCTION: This exam was performed according to the departmental dose-optimization program which includes automated exposure control, adjustment of the mA and/or kV according to patient size and/or use of iterative reconstruction technique. COMPARISON:  CT of the pelvis and ultrasound of scrotum from earlier in the same day. FINDINGS: Lower chest: Heavily calcified granuloma is noted in the left lower lobe. Hepatobiliary: Fatty infiltration of the liver is noted. The gallbladder is within normal limits. Pancreas: Unremarkable. No pancreatic ductal dilatation or surrounding inflammatory changes. Spleen: Normal in size without focal abnormality. Adrenals/Urinary Tract: Adrenal glands are  within normal limits. Kidneys are well visualized bilaterally with normal excretion. Renal cystic change is noted bilaterally. The largest of these measures 6.8 cm in the anterior aspect of the left kidney. No obstructive changes are seen. The bladder is well distended with opacified urine. Stomach/Bowel: Diverticular change of the colon is noted. No evidence of diverticulitis. Appendix is within normal limits. Small bowel and stomach are within normal limits. Vascular/Lymphatic: Aortic atherosclerosis. No enlarged abdominal or pelvic lymph nodes. Reproductive: Prostate is unremarkable. Other: No abdominal wall hernia or abnormality. No abdominopelvic ascites. Musculoskeletal: Degenerative changes of lumbar spine are noted. IMPRESSION: No acute abnormality correspond with the given clinical history. 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 These are stable from a prior exam of 08/02/2021. No further follow-up is recommended. Changes of prior granulomatous disease. Diverticulosis without diverticulitis. Electronically Signed   By: Alcide Clever M.D.   On: 01/16/2022 21:00   US SCROTUM W/DOPPLER  Result Date: 01/16/2022 CLINICAL DATA:  Left testicular pain. EXAM: SCROTAL ULTRASOUND DOPPLER ULTRASOUND OF THE TESTICLES TECHNIQUE: Complete ultrasound examination of the testicles, epididymis, and other scrotal structures was performed. Color and spectral Doppler ultrasound were also utilized to evaluate blood flow to the testicles. COMPARISON:  None Available. FINDINGS: Right testicle Measurements: 4.3 x 2.4 x 2.9 cm. No mass or microlithiasis visualized. Left testicle Measurements: 4.5 x 3.1 x 2.5 cm. No mass or microlithiasis visualized. A 3 mm probable testicular appendix. Right epididymis: Multiple epididymal head cysts measure up to 1.3 cm. Left epididymis: Multiple appear normal head cysts measure up to 1.2 cm. Hydrocele: Small minimally complex bilateral hydroceles with low-level floating echogenic  debris. Varicocele:  None visualized. Pulsed Doppler interrogation of both testes demonstrates normal low resistance arterial and venous waveforms bilaterally. No obvious hernia noted in the left groin. IMPRESSION: 1. Unremarkable testicles. 2. A 3 mm probable left testicular appendix. 3. Small bilateral epididymal head cysts. 4. Small minimally complex bilateral hydroceles. Electronically Signed   By: Elgie Collard M.D.   On: 01/16/2022 17:49    Procedures Procedures    Medications Ordered in ED Medications  HYDROcodone-acetaminophen (NORCO/VICODIN) 5-325 MG per tablet 1 tablet (1 tablet Oral Not Given 01/16/22 2149)  ketorolac (TORADOL) 30 MG/ML injection 30 mg (has no administration in time range)  morphine (PF) 4 MG/ML injection 4 mg (4 mg Intravenous Given 01/16/22 1633)  ondansetron (ZOFRAN) injection 4 mg (4 mg Intravenous Given 01/16/22 1632)  HYDROmorphone (DILAUDID) injection 1 mg (1 mg Intravenous Given 01/16/22 1809)  iohexol (OMNIPAQUE) 300  MG/ML solution 100 mL (100 mLs Intravenous Contrast Given 01/16/22 1814)  HYDROmorphone (DILAUDID) injection 1 mg (1 mg Intravenous Given 01/16/22 2023)    ED Course/ Medical Decision Making/ A&P Clinical Course as of 01/16/22 2152  Wed Jan 16, 2022  1712 Leukocytes,Ua(!): TRACE [JS]  1712 Bacteria, UA(!): RARE [JS]    Clinical Course User Index [JS] Claude MangesSoto, Mary-Ann Pennella, PA-C                           Medical Decision Making Patient presents to the ED with a chief complaint of left testicular pain for the past 8 days.  Prior history of prostatectomy by Dr. Urban GibsonMani of [facility] in December 2022.  Here with worsening symptoms, no relief with over-the-counter medication. Differential diagnoses included but not limited to epididymitis, testicular torsion versus varicole.   Labs on today's visit remarkable for white count  Amount and/or Complexity of Data Reviewed Labs: ordered. Decision-making details documented in ED Course.    Details: White  blood cell count of 16,000, creatinine function is worsening at 1.4, GFR at 55.  No electrolyte derangement.  UA with no signs of infection, but trace of bacteria. Radiology: ordered.    Details: Ultrasound of the scrotum with a 3 mm probable appendix to the left testicular region.  No torsion noted.  CT of the pelvis without any acute finding.  CT of renal area obtained to rule out infectious stone, negative for these. Discussion of management or test interpretation with external provider(s): Discussed case at length with urology on-call Dr. Retta Dionesahlstedt who reviewed case and suspect likely, called epididymitis.  Patient will benefit from IV Toradol despite kidney function, will also send home on ibuprofen for symptomatic control.  Will need recheck at office prior to the week ending.  Risk Prescription drug management. Decision regarding hospitalization.  Patient presents to the ED with a chief complaint of left testicular pain for the past 8 days.  Prior history of prostatectomy by Dr. Urban GibsonMani of [facility] in December 2022.  Here with worsening symptoms, no relief with over-the-counter medication.    9:49 PM discussed ultrasound, CT, blood work with patient at length.  I did discuss consultation with urology who recommended NSAIDs, doxycycline treatment along with recheck on outpatient basis.  Patient is agreeable of this at this time, he is requesting narcotic pain medication due to ongoing pain being very severe.  Will prescribe Norco at this time to help with pain control, will have him follow-up with his urologist Dr. Berneice HeinrichManny.  Portions of this note were generated with Scientist, clinical (histocompatibility and immunogenetics)Dragon dictation software. Dictation errors may occur despite best attempts at proofreading.   Final Clinical Impression(s) / ED Diagnoses Final diagnoses:  Left testicular pain    Rx / DC Orders ED Discharge Orders          Ordered    ibuprofen (ADVIL) 600 MG tablet  Every 6 hours PRN        01/16/22 2141     doxycycline (VIBRAMYCIN) 100 MG capsule  2 times daily        01/16/22 2149    HYDROcodone-acetaminophen (NORCO/VICODIN) 5-325 MG tablet  Every 4 hours PRN        01/16/22 2151              Claude MangesSoto, Donyell Carrell, PA-C 01/16/22 2152    Cheryll CockayneHong, Joshua S, MD 01/18/22 2313

## 2022-01-16 NOTE — ED Notes (Signed)
Pt states after standing to void, pain returned 9/10 ?[place] received and renal stone study placed ?

## 2022-01-16 NOTE — ED Triage Notes (Signed)
He c/o left testicle pain x 8 days, which is worsening. He cites robotically assisted prostatectomy in Dec. Of 2022. He called his urologist today (Dr. Berneice Heinrich), who advised him to go to E.D. He denies fever. ?

## 2022-01-16 NOTE — ED Notes (Signed)
Pt is sleeping will give Vicodin when he awakens ? ?

## 2022-01-16 NOTE — Discharge Instructions (Addendum)
We discussed the results of your CT, ultrasound along with labs at length. ? ?I have prescribed medication to help treat your symptoms.  Doxycycline is an antibiotic, you will need to take 1 tablet twice a day for the next 7 days.  In addition you were given a prescription for ibuprofen to help with moderate pain.  I have also given you a short prescription of Norco to help with severe pain, please take this medication only for severe pain.  Please be aware this medication can make you drowsy do not drink alcohol or drive while taking this medication. ? ?Call [facility] in the morning in order to have a recheck appointment by the end of the week. ? ?If you experience any worsening symptoms, fevers, vomiting you will need to return to the emergency department. ?

## 2022-01-22 ENCOUNTER — Encounter: Payer: Commercial Managed Care - HMO | Admitting: Physical Therapy

## 2022-01-30 ENCOUNTER — Ambulatory Visit: Payer: Commercial Managed Care - HMO | Admitting: Physical Therapy

## 2022-01-30 DIAGNOSIS — M6281 Muscle weakness (generalized): Secondary | ICD-10-CM

## 2022-01-30 DIAGNOSIS — R293 Abnormal posture: Secondary | ICD-10-CM

## 2022-01-30 DIAGNOSIS — M62838 Other muscle spasm: Secondary | ICD-10-CM
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
## 2022-02-06 ENCOUNTER — Ambulatory Visit: Payer: Commercial Managed Care - HMO | Attending: Urology | Admitting: Physical Therapy

## 2022-02-06 DIAGNOSIS — R279 Unspecified lack of coordination: Secondary | ICD-10-CM | POA: Insufficient documentation

## 2022-02-06 DIAGNOSIS — M62838 Other muscle spasm: Secondary | ICD-10-CM | POA: Insufficient documentation

## 2022-02-06 DIAGNOSIS — M6281 Muscle weakness (generalized): Secondary | ICD-10-CM | POA: Insufficient documentation

## 2022-02-06 DIAGNOSIS — R293 Abnormal posture: Secondary | ICD-10-CM | POA: Insufficient documentation
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
## 2022-02-12 ENCOUNTER — Ambulatory Visit: Payer: Commercial Managed Care - HMO | Admitting: Physical Therapy

## 2022-02-12 DIAGNOSIS — M6281 Muscle weakness (generalized): Secondary | ICD-10-CM

## 2022-02-12 DIAGNOSIS — R293 Abnormal posture: Secondary | ICD-10-CM

## 2022-02-12 DIAGNOSIS — R279 Unspecified lack of coordination: Secondary | ICD-10-CM
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
## 2022-02-19 ENCOUNTER — Ambulatory Visit: Payer: Commercial Managed Care - HMO | Admitting: Physical Therapy

## 2022-02-19 DIAGNOSIS — M6281 Muscle weakness (generalized): Secondary | ICD-10-CM

## 2022-02-19 DIAGNOSIS — R293 Abnormal posture: Secondary | ICD-10-CM

## 2022-02-19 DIAGNOSIS — R279 Unspecified lack of coordination: Secondary | ICD-10-CM
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
## 2022-02-26 ENCOUNTER — Ambulatory Visit: Payer: Commercial Managed Care - HMO | Admitting: Physical Therapy

## 2022-02-26 DIAGNOSIS — R279 Unspecified lack of coordination: Secondary | ICD-10-CM

## 2022-02-26 DIAGNOSIS — M6281 Muscle weakness (generalized): Secondary | ICD-10-CM

## 2022-12-16 IMAGING — CT CT PELVIS W/ CM
2 of 3 series · 17 of 46 positions shown, 19 images · IV contrast (APPLIED)
Comparison: Ultrasound of same day.  CT scan August 02, 2021.

CLINICAL DATA: Left testicular pain.

EXAM:
CT PELVIS WITH CONTRAST
TECHNIQUE: Multidetector CT imaging of the pelvis was performed using the
standard protocol following the bolus administration of intravenous
contrast.

[Series 2: pelvis w · axial · 0.76mm/px · z∈[+728,+1053]mm · 14 of 75 slices shown, 16 images]
[im 5/75  soft-tissue]
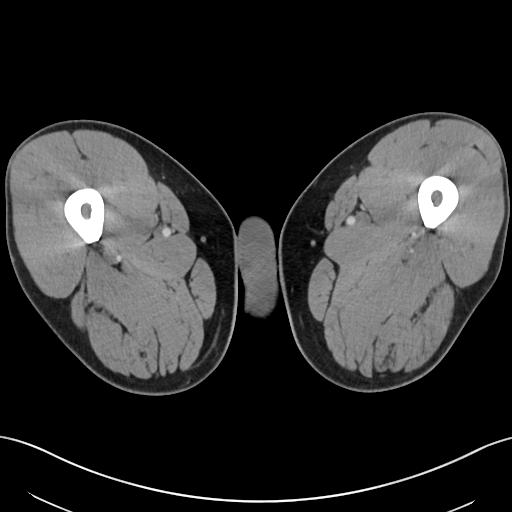
[im 5/75  bone]
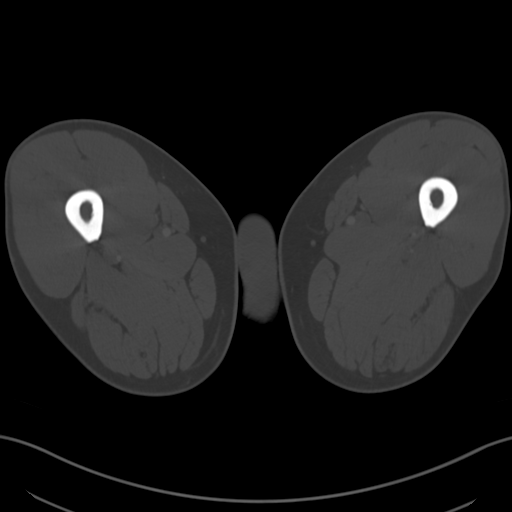
[im 10/75  soft-tissue]
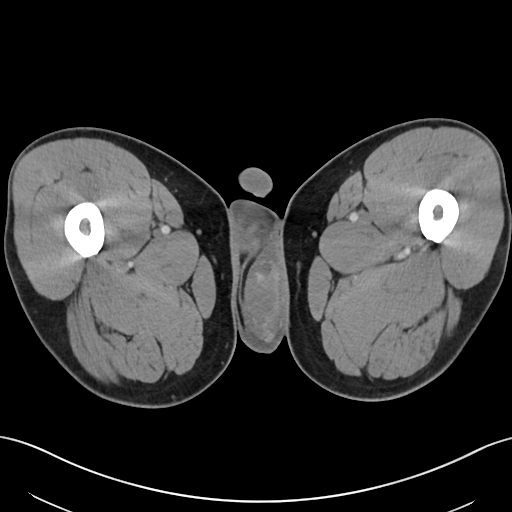
[im 15/75  soft-tissue]
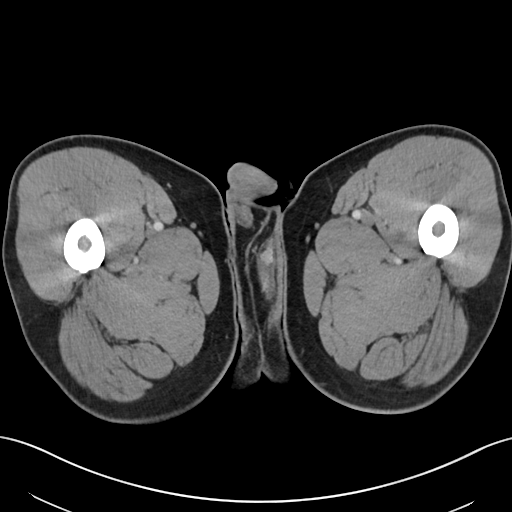
[im 20/75  soft-tissue]
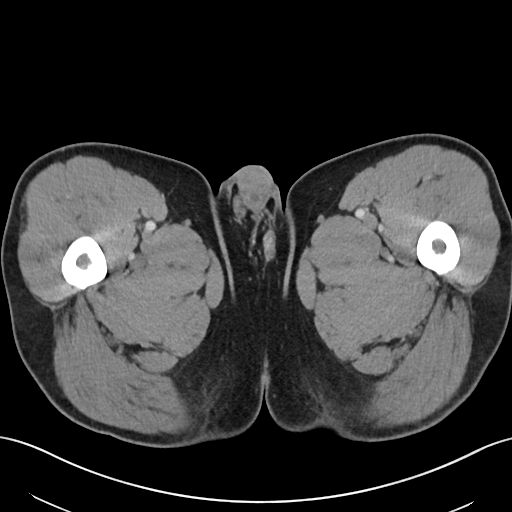
[im 24/75  soft-tissue]
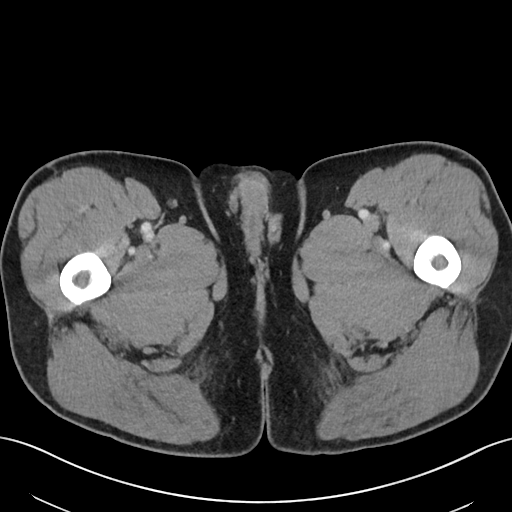
[im 29/75  soft-tissue]
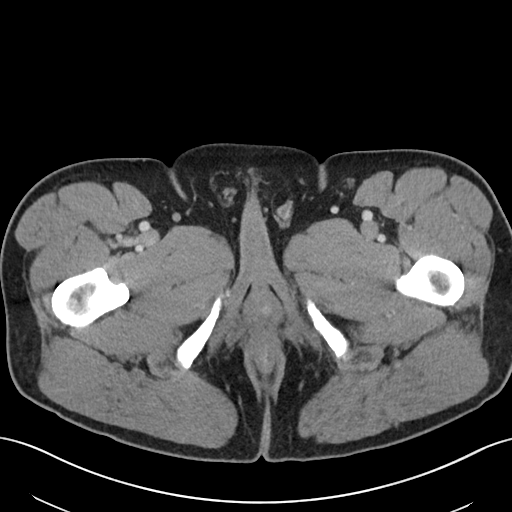
[im 34/75  soft-tissue]
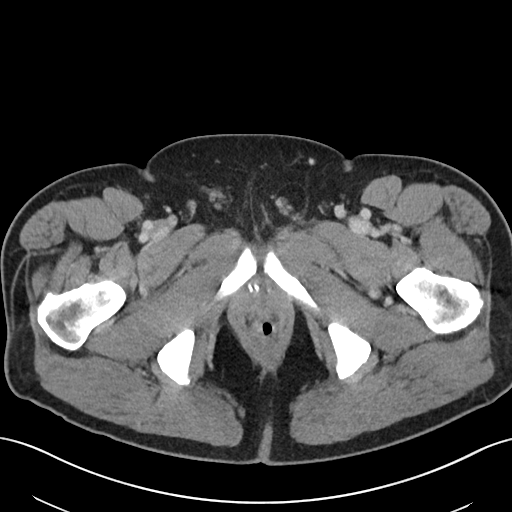
[im 41/75  soft-tissue]
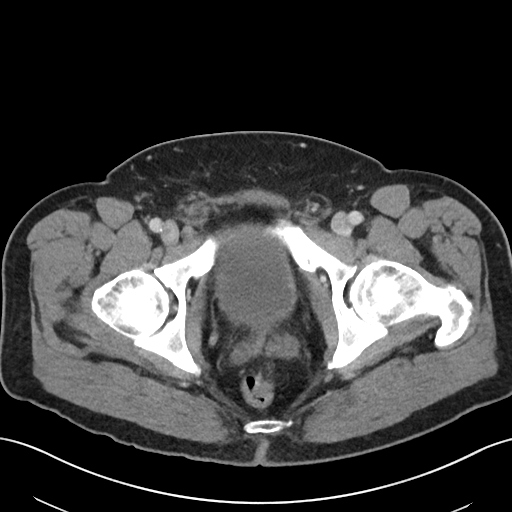
[im 46/75  soft-tissue]
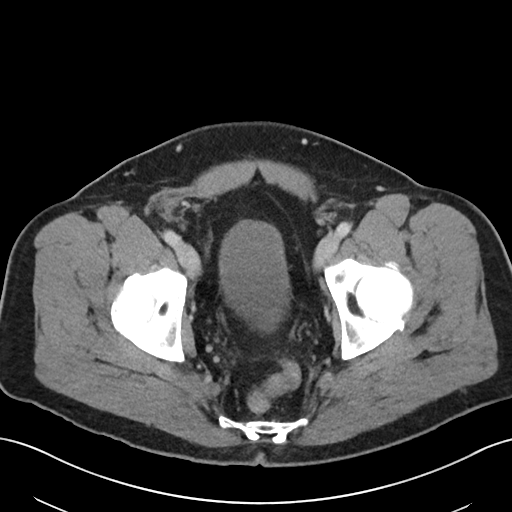
[im 46/75  bone]
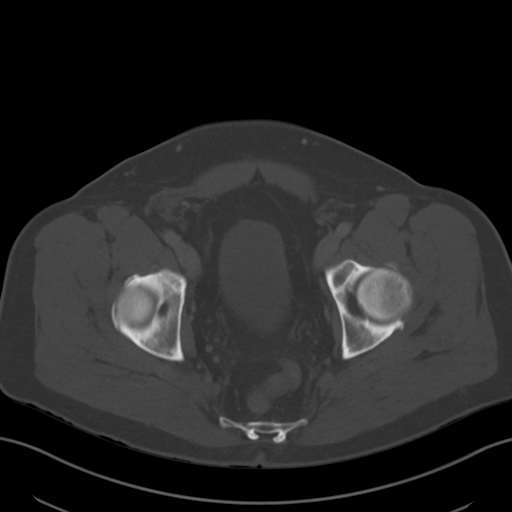
[im 51/75  soft-tissue]
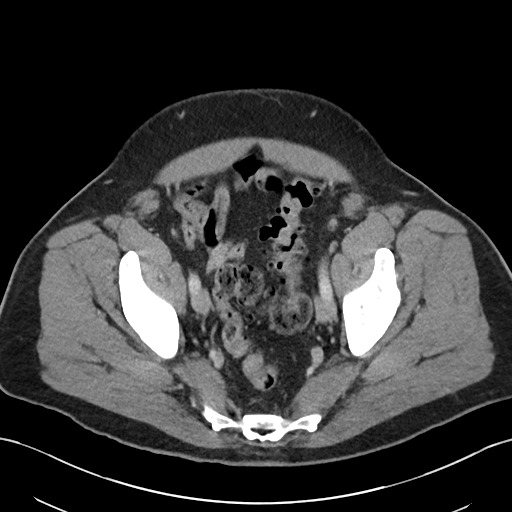
[im 55/75  soft-tissue]
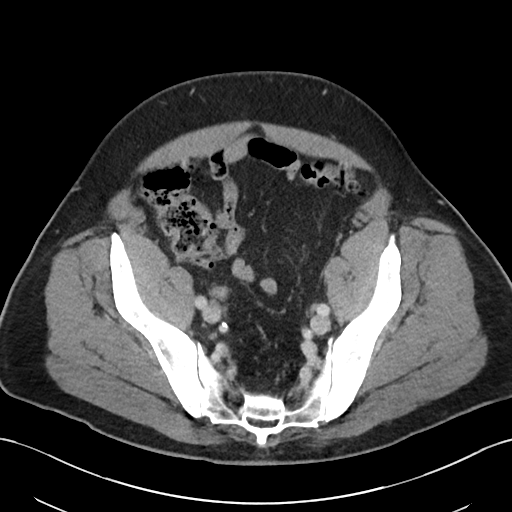
[im 60/75  soft-tissue]
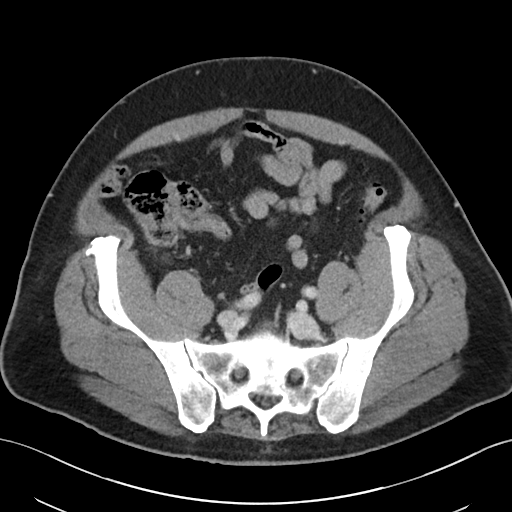
[im 65/75  soft-tissue]
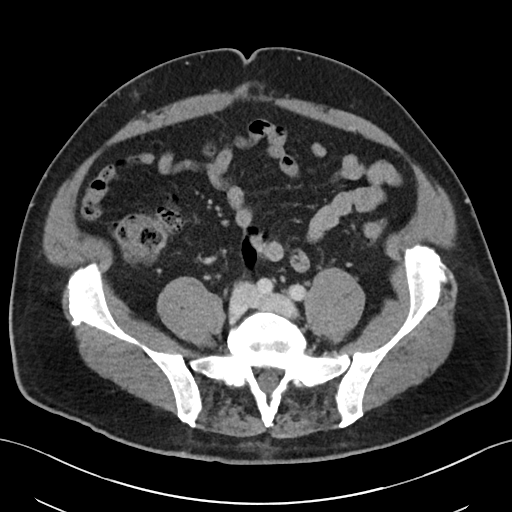
[im 70/75  soft-tissue]
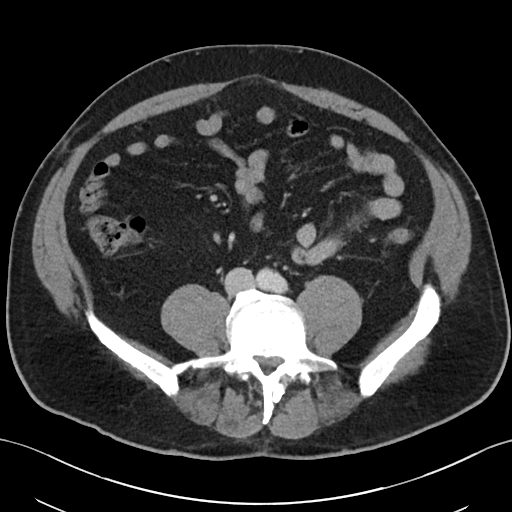

[Series 4: coronal · coronal · 0.74mm/px · 3 of 106 slices shown]
[im 36/106  soft-tissue]
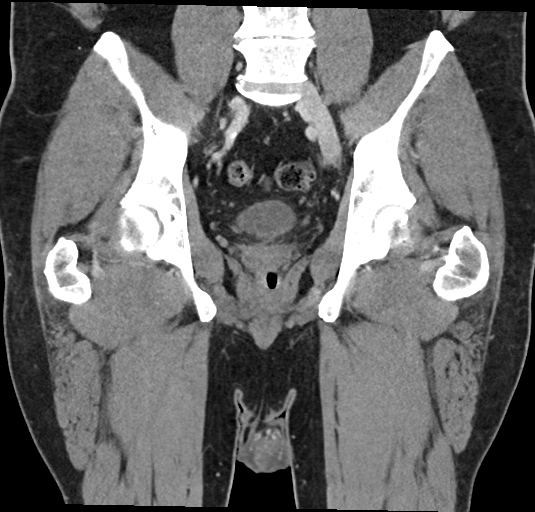
[im 47/106  soft-tissue]
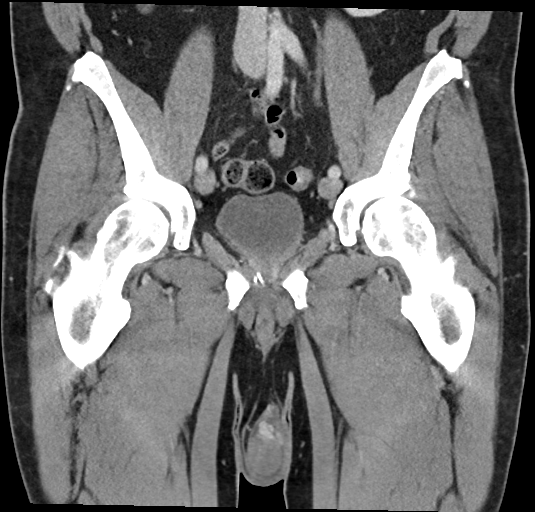
[im 59/106  soft-tissue]
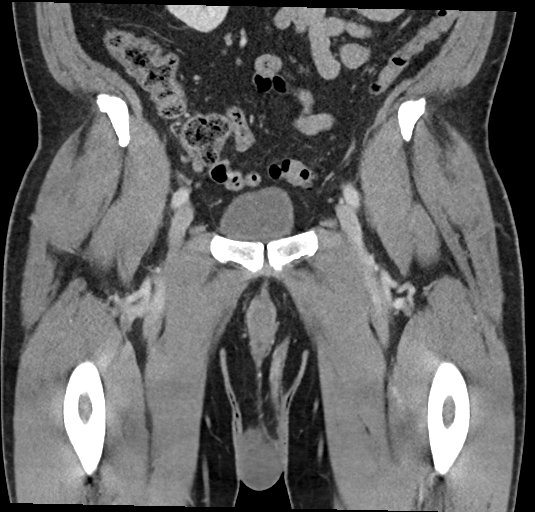

[17 of 46 positions shown; findings below may reference images not displayed]

RADIATION DOSE REDUCTION: This exam was performed according to the
departmental dose-optimization program which includes automated
exposure control, adjustment of the mA and/or kV according to
patient size and/or use of iterative reconstruction technique.

CONTRAST:  100mL OMNIPAQUE IOHEXOL 300 MG/ML  SOLN
FINDINGS: Urinary Tract:  No abnormality visualized.

Bowel:  Unremarkable visualized pelvic bowel loops.

Vascular/Lymphatic: No pathologically enlarged lymph nodes. No
significant vascular abnormality seen.

Reproductive:  Status post prostatectomy.

Other: Small fat containing left inguinal hernia is noted. No
ascites is noted. Small bilateral hydroceles as noted on scrotal
ultrasound of same day.

Musculoskeletal: No suspicious bone lesions identified.
IMPRESSION: Small fat containing left inguinal hernia. Small bilateral
hydroceles as noted on scrotal ultrasound of same day.

## 2022-12-16 IMAGING — US US SCROTUM W/ DOPPLER COMPLETE
1 series · 13 of 25 positions shown · non-contrast
Comparison: None Available.

CLINICAL DATA: Left testicular pain.

EXAM:
SCROTAL ULTRASOUND
DOPPLER ULTRASOUND OF THE TESTICLES
TECHNIQUE: Complete ultrasound examination of the testicles, epididymis, and
other scrotal structures was performed. Color and spectral Doppler
ultrasound were also utilized to evaluate blood flow to the
testicles.

[Series 1: us scrotum w/doppler · 67 acquisitions, 13 frames shown]
[im 1/67]
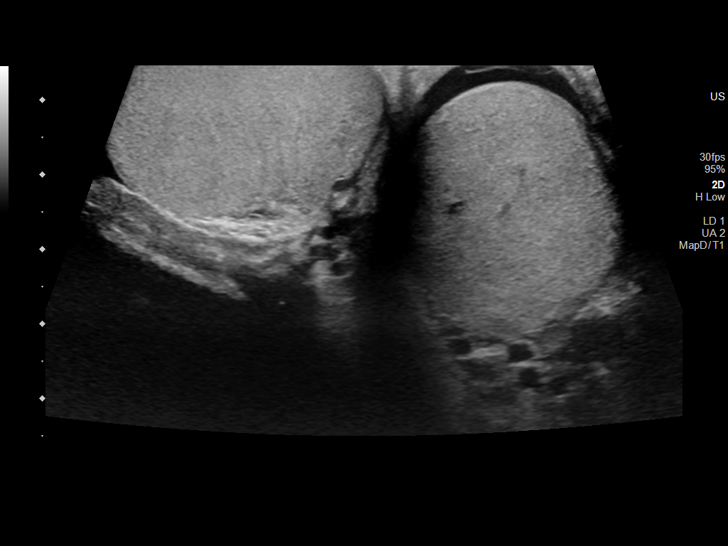
[im 6/67]
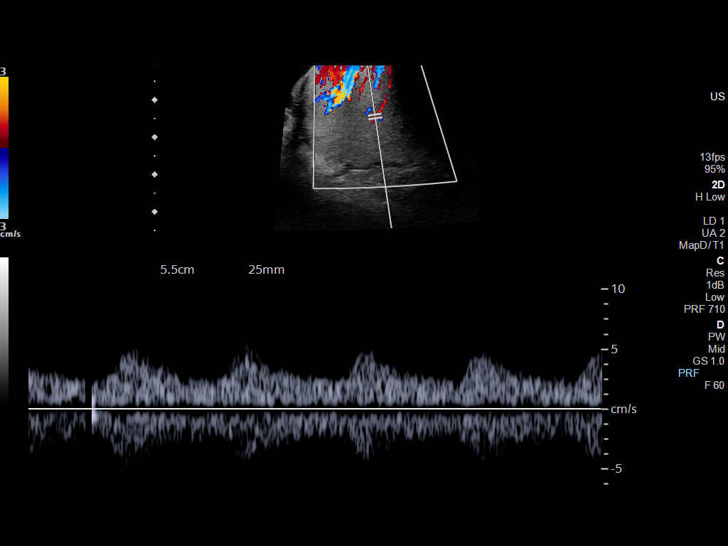
[im 12/67]
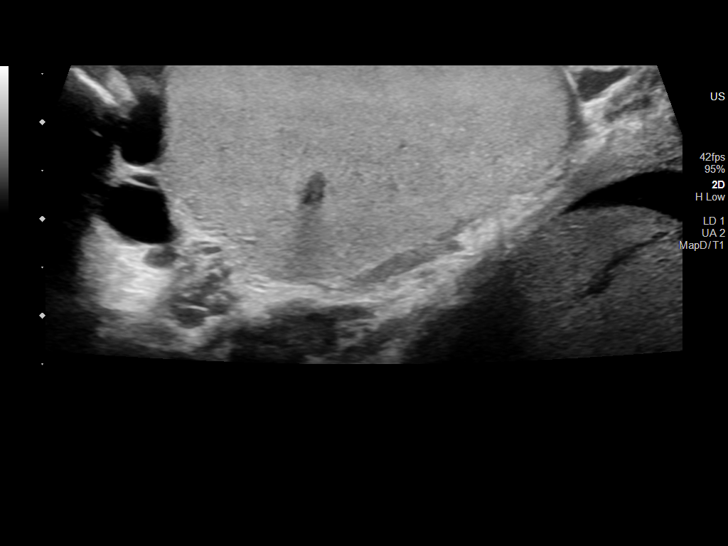
[im 17/67]
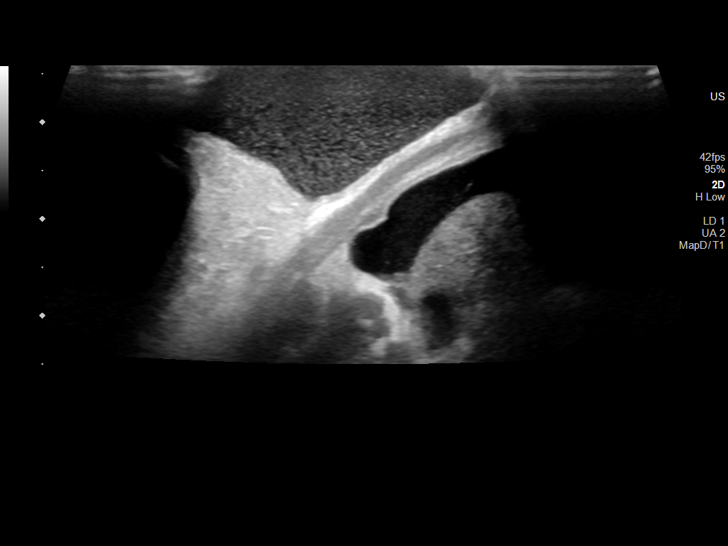
[im 23/67]
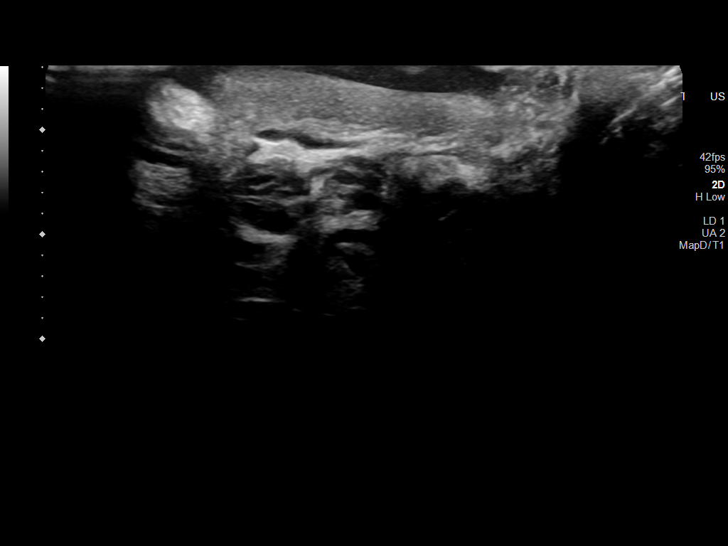
[im 28/67]
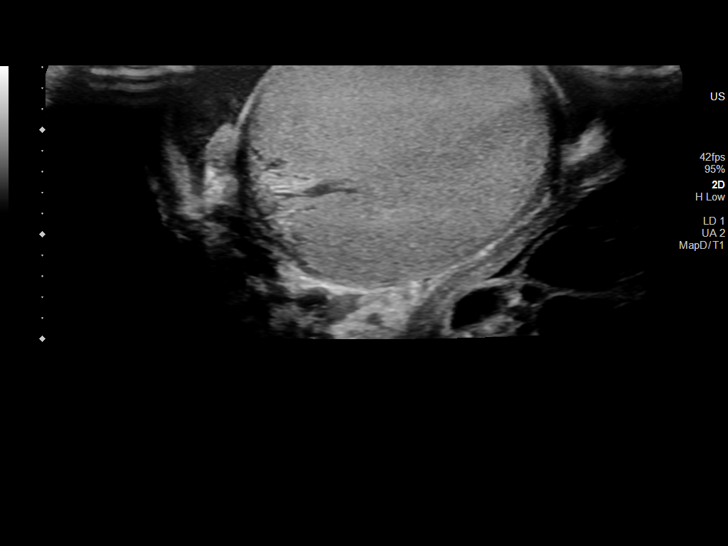
[im 34/67]
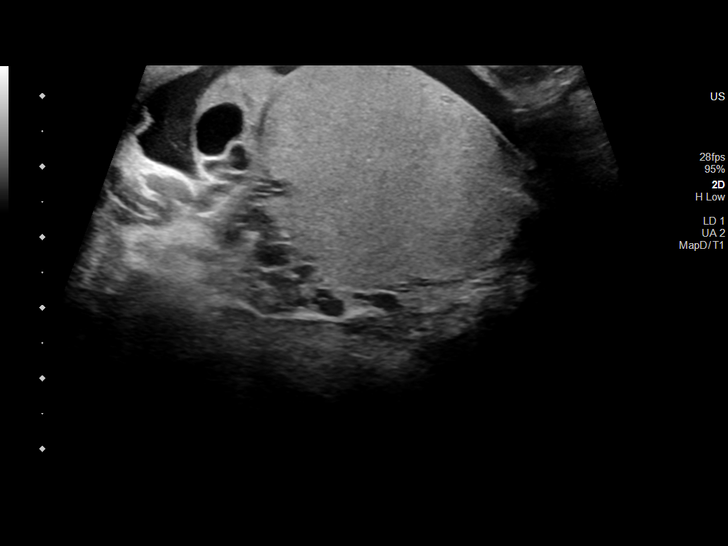
[im 39/67]
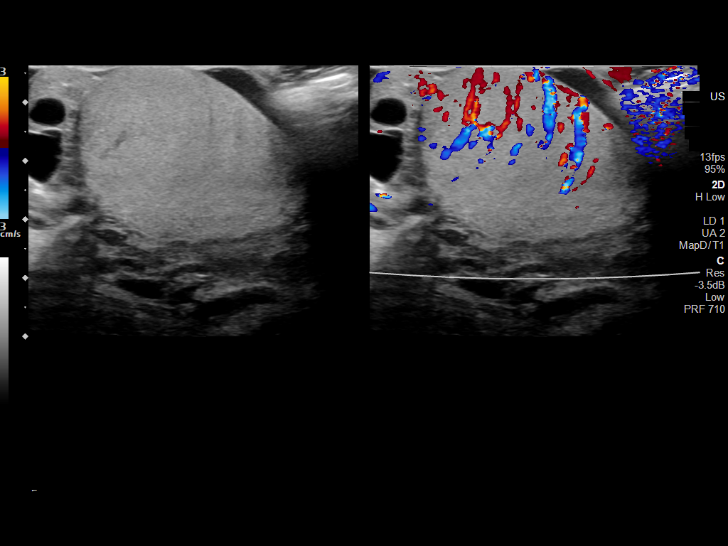
[im 45/67]
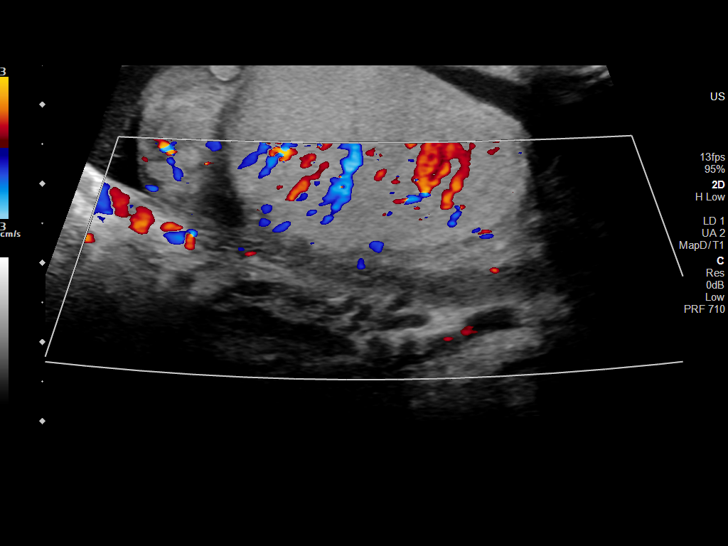
[im 50/67]
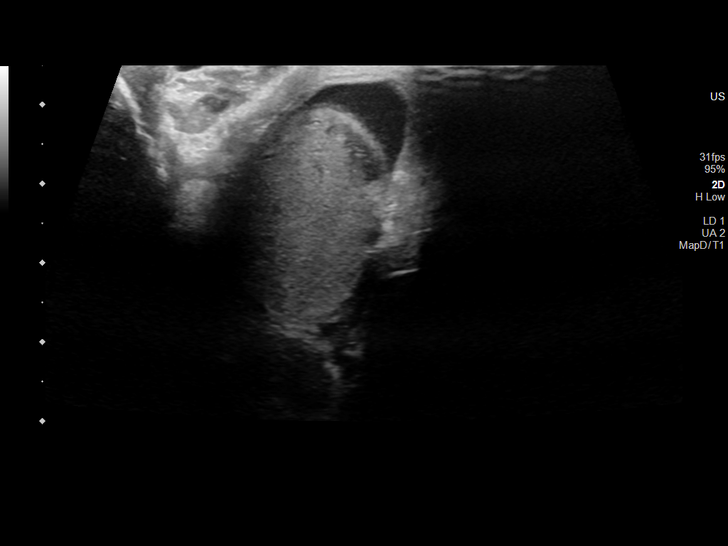
[im 56/67]
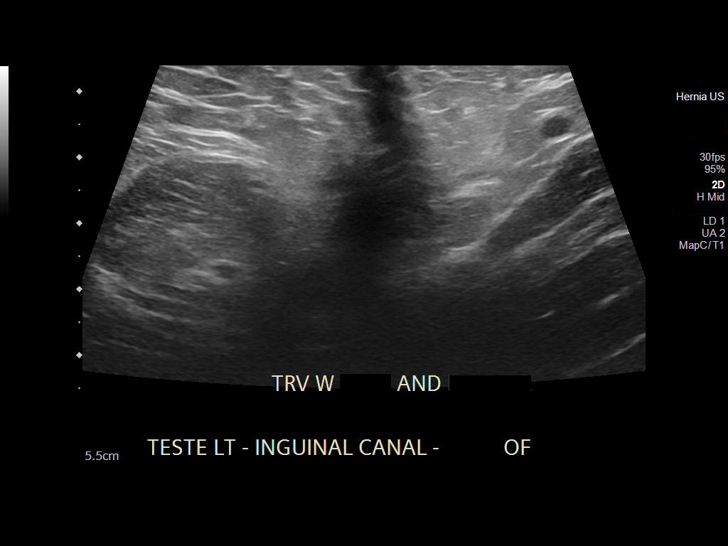
[im 61/67]
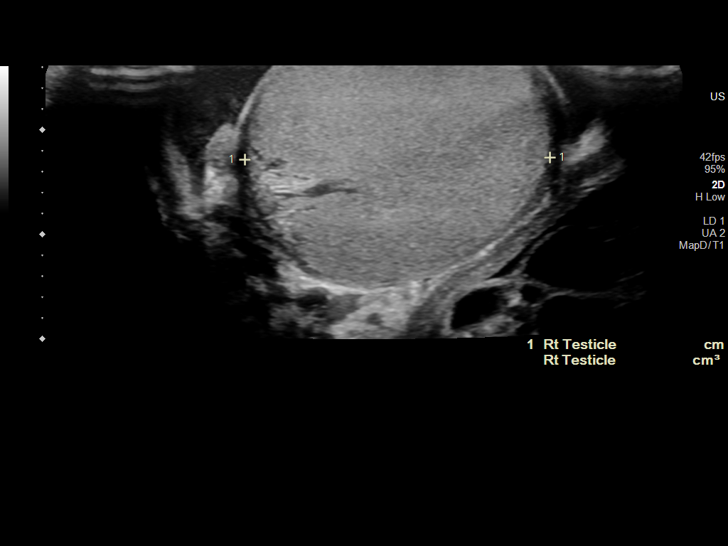
[im 67/67]
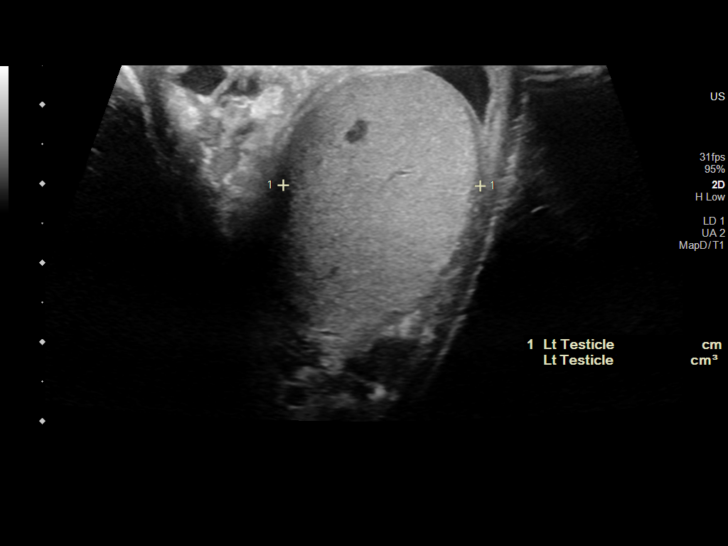

[13 of 25 positions shown; findings below may reference images not displayed]

FINDINGS: Right testicle

Measurements: 4.3 x 2.4 x 2.9 cm. No mass or microlithiasis
visualized.

Left testicle

Measurements: 4.5 x 3.1 x 2.5 cm. No mass or microlithiasis
visualized. A 3 mm probable testicular appendix.

Right epididymis: Multiple epididymal head cysts measure up to
cm.

Left epididymis: Multiple appear normal head cysts measure up to
cm.

Hydrocele: Small minimally complex bilateral hydroceles with
low-level floating echogenic debris.

Varicocele:  None visualized.

Pulsed Doppler interrogation of both testes demonstrates normal low
resistance arterial and venous waveforms bilaterally.

No obvious hernia noted in the left groin.
IMPRESSION: 1. Unremarkable testicles.
2. A 3 mm probable left testicular appendix.
3. Small bilateral epididymal head cysts.
4. Small minimally complex bilateral hydroceles.
